# Patient Record
Sex: Male | Born: 1940 | Race: White | Hispanic: No | Marital: Married | Smoking: Never smoker
Health system: Southern US, Community
[De-identification: ages and names within clinical notes are randomized; demographics above are authoritative.]

## PROBLEM LIST (undated history)

## (undated) DIAGNOSIS — F419 Anxiety disorder, unspecified: Secondary | ICD-10-CM

## (undated) DIAGNOSIS — K219 Gastro-esophageal reflux disease without esophagitis: Secondary | ICD-10-CM

## (undated) DIAGNOSIS — F329 Major depressive disorder, single episode, unspecified: Secondary | ICD-10-CM

## (undated) DIAGNOSIS — G4730 Sleep apnea, unspecified: Secondary | ICD-10-CM

## (undated) DIAGNOSIS — N429 Disorder of prostate, unspecified: Secondary | ICD-10-CM

## (undated) DIAGNOSIS — F32A Depression, unspecified: Secondary | ICD-10-CM

## (undated) DIAGNOSIS — R42 Dizziness and giddiness: Secondary | ICD-10-CM

## (undated) DIAGNOSIS — F410 Panic disorder [episodic paroxysmal anxiety] without agoraphobia: Secondary | ICD-10-CM

## (undated) DIAGNOSIS — E785 Hyperlipidemia, unspecified: Secondary | ICD-10-CM

## (undated) DIAGNOSIS — I10 Essential (primary) hypertension: Secondary | ICD-10-CM

## (undated) HISTORY — PX: COLONOSCOPY W/ BIOPSIES: SHX1374

## (undated) HISTORY — DX: Gastro-esophageal reflux disease without esophagitis: K21.9

## (undated) HISTORY — DX: Anxiety disorder, unspecified: F41.9

## (undated) HISTORY — DX: Disorder of prostate, unspecified: N42.9

## (undated) HISTORY — DX: Sleep apnea, unspecified: G47.30

## (undated) HISTORY — DX: Panic disorder (episodic paroxysmal anxiety): F41.0

## (undated) HISTORY — DX: Hyperlipidemia, unspecified: E78.5

## (undated) HISTORY — DX: Major depressive disorder, single episode, unspecified: F32.9

## (undated) HISTORY — DX: Essential (primary) hypertension: I10

## (undated) HISTORY — DX: Dizziness and giddiness: R42

## (undated) HISTORY — DX: Depression, unspecified: F32.A

## (undated) HISTORY — PX: UPPER GASTROINTESTINAL ENDOSCOPY: SHX188

---

## 1990-10-19 HISTORY — PX: TRANSURETHRAL RESECTION OF PROSTATE: SHX73

## 1996-10-19 HISTORY — PX: INGUINAL HERNIA REPAIR: SUR1180

## 2014-08-21 ENCOUNTER — Telehealth: Payer: Self-pay | Admitting: Internal Medicine

## 2014-08-27 NOTE — Telephone Encounter (Signed)
Dr Leone PayorGessner spoke with patient , transient constipation .  Dr Leone PayorGessner said to place colon recall for 06/2016.  Recall put into epic.

## 2015-06-13 ENCOUNTER — Encounter: Payer: Self-pay | Admitting: Internal Medicine

## 2015-08-22 ENCOUNTER — Encounter: Payer: Self-pay | Admitting: Internal Medicine

## 2015-08-22 ENCOUNTER — Ambulatory Visit (INDEPENDENT_AMBULATORY_CARE_PROVIDER_SITE_OTHER): Payer: Medicare Other | Admitting: Internal Medicine

## 2015-08-22 VITALS — BP 96/70 | HR 68 | Ht 66.25 in | Wt 169.5 lb

## 2015-08-22 DIAGNOSIS — K219 Gastro-esophageal reflux disease without esophagitis: Secondary | ICD-10-CM | POA: Diagnosis not present

## 2015-08-22 DIAGNOSIS — R1314 Dysphagia, pharyngoesophageal phase: Secondary | ICD-10-CM | POA: Diagnosis not present

## 2015-08-22 DIAGNOSIS — R1310 Dysphagia, unspecified: Secondary | ICD-10-CM

## 2015-08-22 DIAGNOSIS — R1319 Other dysphagia: Secondary | ICD-10-CM

## 2015-08-22 MED ORDER — PANTOPRAZOLE SODIUM 40 MG PO TBEC: 40.0000 mg | DELAYED_RELEASE_TABLET | Freq: Every day | ORAL | Status: DC

## 2015-08-22 NOTE — Progress Notes (Signed)
   Subjective:    Patient ID: Tristan RaiderJulio Sherman, male    DOB: [birth year], 74 y.o.   MRN: [id number] Cc: swallowing problems and indigestion HPI The patient has a several month hx of intermittent solid dysphagia and heartburn w/ regurgitation. Some increasing frequency of sxs, now several x a week though better since cutting food smaller and chewing more. No unintentional weight loss. Has not tried any OTC Tx or Rx. About to become a grandfather for 9th time. GI ROS o/w negative. Medications, allergies, past medical history, past surgical history, family history and social history are reviewed and updated in the EMR.   Review of Systems Some urinary frequency and leakage all other ROS neg or as per HPI    Objective:   Physical Exam @BP  96/70 mmHg  Pulse 68  Ht 5' 6.25" (1.683 m)  Wt 169 lb 8 oz (76.885 kg)  BMI 27.14 kg/m2@  General:  Well-developed, well-nourished and in no acute distress Eyes:  anicteric. ENT:   Mouth and posterior pharynx free of lesions. + dentures - fit well Neck:   supple w/o thyromegaly or mass.  Lungs: Clear to auscultation bilaterally. Heart:  S1S2, no rubs, murmurs, gallops. Abdomen:  soft, non-tender, no hepatosplenomegaly, hernia, or mass and BS+.  Lymph:  no cervical or supraclavicular adenopathy. Extremities:   no edema, cyanosis or clubbing Skin   no rash. Neuro:  A&O x 3.  Psych:  appropriate mood and  Affect.     Assessment & Plan:  Esophageal dysphagia  Gastroesophageal reflux disease, esophagitis presence not specified  Start PPI - pantoprazole 40 mg qd EGD, possible esophageal dilation The risks and benefits as well as alternatives of endoscopic procedure(s) have been discussed and reviewed. All questions answered. The patient agrees to proceed.  I appreciate the opportunity to care for this patient.  OV:FIEPPIRJJ,OACZYSCc:POMPOSINI,DANIEL L, MD

## 2015-08-22 NOTE — Patient Instructions (Addendum)
   You have been scheduled for an endoscopy. Please follow written instructions given to you at your visit today. If you use inhalers (even only as needed), please bring them with you on the day of your procedure.  We have sent the following medications to your pharmacy for you to pick up at your convenience: Protonix  Call us if you worsen between now and then.  I appreciate the opportunity to care for you. Stan Headarl Gessner, MD, [facility]

## 2015-08-25 ENCOUNTER — Encounter: Payer: Self-pay | Admitting: Internal Medicine

## 2015-10-25 ENCOUNTER — Ambulatory Visit (AMBULATORY_SURGERY_CENTER): Payer: Medicare Other | Admitting: Internal Medicine

## 2015-10-25 ENCOUNTER — Encounter: Payer: Self-pay | Admitting: Internal Medicine

## 2015-10-25 VITALS — BP 129/76 | HR 69 | Temp 99.1°F | Resp 28 | Ht 66.25 in | Wt 169.0 lb

## 2015-10-25 DIAGNOSIS — R1310 Dysphagia, unspecified: Secondary | ICD-10-CM

## 2015-10-25 DIAGNOSIS — R1319 Other dysphagia: Secondary | ICD-10-CM

## 2015-10-25 DIAGNOSIS — R1314 Dysphagia, pharyngoesophageal phase: Secondary | ICD-10-CM

## 2015-10-25 MED ORDER — SODIUM CHLORIDE 0.9 % IV SOLN: 500.0000 mL | INTRAVENOUS | Status: DC

## 2015-10-25 NOTE — Patient Instructions (Addendum)
I did not see any problems but decided to stretch the esophagus to help the swallowing problems.  Please stay on your medications and let me know if not doing better.  I appreciate the opportunity to care for you. Iva Booparl E. Gessner, MD, [facility] [place]  Clear liquids until 1:30 then soft diet remainder of the day.  Resume regular diet tomorrow.     YOU HAD AN ENDOSCOPIC PROCEDURE TODAY AT THE [facility]:   Refer to the procedure report that was given to you for any specific questions about what was found during the examination.  If the procedure report does not answer your questions, please call your gastroenterologist to clarify.  If you requested that your care partner not be given the details of your procedure findings, then the procedure report has been included in a sealed envelope for you to review at your convenience later.  YOU SHOULD EXPECT: Some feelings of bloating in the abdomen. Passage of more gas than usual.  Walking can help get rid of the air that was put into your GI tract during the procedure and reduce the bloating. If you had a lower endoscopy (such as a colonoscopy or flexible sigmoidoscopy) you may notice spotting of blood in your stool or on the toilet paper. If you underwent a bowel prep for your procedure, you may not have a normal bowel movement for a few days.  Please Note:  You might notice some irritation and congestion in your nose or some drainage.  This is from the oxygen used during your procedure.  There is no need for concern and it should clear up in a day or so.  SYMPTOMS TO REPORT IMMEDIATELY:    Following upper endoscopy (EGD)  Vomiting of blood or coffee ground material  New chest pain or pain under the shoulder blades  Painful or persistently difficult swallowing  New shortness of breath  Fever of 100F or higher  Black, tarry-looking stools  For urgent or emergent issues, a gastroenterologist can be reached at any hour by calling [phone]  [phone].   DIET: Your first meal following the procedure should be a small meal and then it is ok to progress to your normal diet. Heavy or fried foods are harder to digest and may make you feel nauseous or bloated.  Likewise, meals heavy in dairy and vegetables can increase bloating.  Drink plenty of fluids but you should avoid alcoholic beverages for 24 hours.  ACTIVITY:  You should plan to take it easy for the rest of today and you should NOT DRIVE or use heavy machinery until tomorrow (because of the sedation medicines used during the test).    FOLLOW UP: Our staff will call the number listed on your records the next business day following your procedure to check on you and address any questions or concerns that you may have regarding the information given to you following your procedure. If we do not reach you, we will leave a message.  However, if you are feeling well and you are not experiencing any problems, there is no need to return our call.  We will assume that you have returned to your regular daily activities without incident.  If any biopsies were taken you will be contacted by phone or by letter within the next 1-3 weeks.  Please call us at [phone] if you have not heard about the biopsies in 3 weeks.    SIGNATURES/CONFIDENTIALITY: You and/or your care partner have signed paperwork which  will be entered into your electronic medical record.  These signatures attest to the fact that that the information above on your After Visit Summary has been reviewed and is understood.  Full responsibility of the confidentiality of this discharge information lies with you and/or your care-partner.

## 2015-10-25 NOTE — Progress Notes (Signed)
Stable to RR 

## 2015-10-25 NOTE — Op Note (Signed)
[facility] [address] [place], [id number]   ENDOSCOPY PROCEDURE REPORT  PATIENT: [name], Tristan  MR#: [id number] BIRTHDATE: 1940/11/12 , 74  yrs. old GENDER: male ENDOSCOPIST: Iva Booparl E Berneita Sanagustin, MD, [facility] PROCEDURE DATE:  10/25/2015 PROCEDURE:  EGD, diagnostic and Maloney dilation of esophagus ASA CLASS:     Class II INDICATIONS:  dysphagia. MEDICATIONS: Propofol 100 mg IV, Monitored anesthesia care, and Lidocaine 40 mg IV TOPICAL ANESTHETIC: none  DESCRIPTION OF PROCEDURE: After the risks benefits and alternatives of the procedure were thoroughly explained, informed consent was obtained.  The LB [id number] endoscope was introduced through the mouth and advanced to the second portion of the duodenum , Without limitations.  The instrument was slowly withdrawn as the mucosa was fully examined.      EXAM: The esophagus and gastroesophageal junction were completely normal in appearance.  The stomach was entered and closely examined.The antrum, angularis, and lesser curvature were well visualized, including a retroflexed view of the cardia and fundus. The stomach wall was normally distensable.  The scope passed easily through the pylorus into the duodenum.  Retroflexed views revealed no abnormalities.     The scope was then withdrawn , a 54 Fr Maloney dilator passed with ease and no heme, and the procedure completed.  COMPLICATIONS: There were no immediate complications.  ENDOSCOPIC IMPRESSION: Normal appearing esophagus and GE junction, the stomach was well visualized and normal in appearance, normal appearing duodenum - 54 Fr Maloney passed to treat dysphagia  RECOMMENDATIONS: 1.  Clear liquids until 130 PM , then soft foods rest of day. Resume prior diet tomorrow. 2.  Continue PPI     - pantoprazole daily   eSigned:  Iva Booparl E Keyera Hattabaugh, MD, [facility] 10/25/2015 12:22 PM    CC:The Patient and Dr. Reuel Boomaniel Pomposini

## 2015-10-25 NOTE — Progress Notes (Signed)
Called to room to assist during endoscopic procedure.  Patient ID and intended procedure confirmed with present staff. Received instructions for my participation in the procedure from the performing physician.  

## 2015-10-29 ENCOUNTER — Telehealth: Payer: Self-pay | Admitting: *Deleted

## 2015-10-29 NOTE — Telephone Encounter (Signed)
  Follow up Call-  Call back number 10/25/2015  Post procedure Call Back phone  # [phone]     Patient questions:  Do you have a fever, pain , or abdominal swelling? No. Pain Score  0 *  Have you tolerated food without any problems? Yes.    Have you been able to return to your normal activities? Yes.    Do you have any questions about your discharge instructions: Diet   No. Medications  No. Follow up visit  No.  Do you have questions or concerns about your Care? No.  Actions: * If pain score is 4 or above: No action needed, pain <4.

## 2016-04-30 ENCOUNTER — Other Ambulatory Visit: Payer: Self-pay | Admitting: Internal Medicine

## 2016-06-14 ENCOUNTER — Emergency Department (HOSPITAL_COMMUNITY)
Admission: EM | Admit: 2016-06-14 | Discharge: 2016-06-14 | Disposition: A | Discharge status: Home / Self Care | Payer: Medicare Other | Attending: Emergency Medicine | Admitting: Emergency Medicine

## 2016-06-14 ENCOUNTER — Encounter (HOSPITAL_COMMUNITY): Payer: Self-pay | Admitting: Emergency Medicine

## 2016-06-14 DIAGNOSIS — Z7982 Long term (current) use of aspirin: Secondary | ICD-10-CM | POA: Insufficient documentation

## 2016-06-14 DIAGNOSIS — F419 Anxiety disorder, unspecified: Secondary | ICD-10-CM | POA: Insufficient documentation

## 2016-06-14 DIAGNOSIS — R42 Dizziness and giddiness: Secondary | ICD-10-CM | POA: Insufficient documentation

## 2016-06-14 DIAGNOSIS — Z79899 Other long term (current) drug therapy: Secondary | ICD-10-CM | POA: Insufficient documentation

## 2016-06-14 DIAGNOSIS — I10 Essential (primary) hypertension: Secondary | ICD-10-CM | POA: Diagnosis not present

## 2016-06-14 LAB — CBC
HCT: 41.4 % (ref 39.0–52.0)
Hemoglobin: 14 g/dL (ref 13.0–17.0)
MCH: 30.2 pg (ref 26.0–34.0)
MCHC: 33.8 g/dL (ref 30.0–36.0)
MCV: 89.4 fL (ref 78.0–100.0)
Platelets: 206 K/uL (ref 150–400)
RBC: 4.63 MIL/uL (ref 4.22–5.81)
RDW: 12.8 % (ref 11.5–15.5)
WBC: 7.5 K/uL (ref 4.0–10.5)

## 2016-06-14 LAB — BASIC METABOLIC PANEL
Anion gap: 5 (ref 5–15)
BUN: 15 mg/dL (ref 6–20)
CHLORIDE: 108 mmol/L (ref 101–111)
CO2: 26 mmol/L (ref 22–32)
CREATININE: 0.95 mg/dL (ref 0.61–1.24)
Calcium: 8.5 mg/dL — ABNORMAL LOW (ref 8.9–10.3)
GFR calc Af Amer: >60 mL/min (ref >60)
GFR calc non Af Amer: >60 mL/min (ref >60)
Glucose, Bld: 131 mg/dL — ABNORMAL HIGH (ref 65–99)
Potassium: 4 mmol/L (ref 3.5–5.1)
Sodium: 139 mmol/L (ref 135–145)

## 2016-06-14 LAB — URINALYSIS, ROUTINE W REFLEX MICROSCOPIC
Bilirubin Urine: NEGATIVE
GLUCOSE, UA: NEGATIVE mg/dL
HGB URINE DIPSTICK: NEGATIVE
Ketones, ur: NEGATIVE mg/dL
Leukocytes, UA: NEGATIVE
Nitrite: NEGATIVE
PH: 5.5 (ref 5.0–8.0)
PROTEIN: NEGATIVE mg/dL
SPECIFIC GRAVITY, URINE: 1.011 (ref 1.005–1.030)

## 2016-06-14 LAB — TROPONIN I: Troponin I: <0.03 ng/mL (ref <0.03)

## 2016-06-14 MED ORDER — ALPRAZOLAM 0.25 MG PO TABS
0.2500 mg | ORAL_TABLET | Freq: Once | ORAL | Status: AC
  Administered 2016-06-14: 0.25 mg via ORAL
  Filled 2016-06-14: qty 1

## 2016-06-14 MED ORDER — ALPRAZOLAM 0.25 MG PO TABS: 0.2500 mg | ORAL_TABLET | Freq: Two times a day (BID) | ORAL | 0 refills | Status: DC | PRN

## 2016-06-14 NOTE — ED Triage Notes (Signed)
Pt c/o headache/pressure, twitching muscles, anxiety, death anxiety onset this morning, and difficulty focusing vision onset 0930. Pt has ongoing dizziness when he walks upstairs, had syncopal episode yesterday. BP 151/110. No CP.

## 2016-06-14 NOTE — ED Provider Notes (Signed)
75 year old male presents with increasing anxiety as well as dizziness and impending doom. Long-standing history of anxiety. Seen today earlier in urgent care center workup was without acute findings. Workup here today is also reassuring. Patient will be prescribed Xanax and follow-up with Dr.   ED ECG REPORT   Date: 06/14/2016  Rate: 75  Rhythm: normal sinus rhythm  QRS Axis: normal  Intervals: normal  ST/T Wave abnormalities: normal  Conduction Disutrbances:none  Narrative Interpretation:   Old EKG Reviewed: none available  I have personally reviewed the EKG tracing and agree with the computerized printout as noted.   Lorre NickAnthony Bernetha Anschutz, MD 06/14/16 2121

## 2016-06-14 NOTE — ED Provider Notes (Signed)
[facility]-EMERGENCY DEPT Provider Note   CSN: [id number] Arrival date & time: 06/14/16  1847  By signing my name below, I, Jasmyn B. Alexander, attest that this documentation has been prepared under the direction and in the presence of Luree Palla, PA-C. Electronically Signed: Gillis Ends. Lyn Hollingshead, ED Scribe. 06/14/16. 8:32 PM.   History   Chief Complaint Chief Complaint  Patient presents with  . Dizziness  . Hypertension    HPI HPI Comments: Tristan Sherman is a 75 y.o. male with PMHx of GERD, HLD, and Orthostatic HTN who presents to the Emergency Department complaining of gradual onset, intermittent, dizziness x 0930. He reports that symptoms began while he was at church earlier this morning. Per pt, he notes that his wife is having a knee replacement on 06/15/16 which could possibly be causing him anxiety. Pt has associated headache, anxiety, and muscle spasms in his lower back. He reports that he feels like "dying". He states everything is "moving in slow motion" when symptoms are present. He had chills which have now resolved. He states that he went to [facility] in [place], [place] early afternoon where he received IV fluids due to possible dehydration. His headache is exacerbated when standing and when pt is hypertensive. Pt's wife notes that his pressure was in the 170's after receiving IV fluids. Symptoms were partially resolved after visit and later returned which caused him to come to [facility]. Denies any abdominal pain, nausea, vomiting, diarrhea, and chest pain.  The history is provided by the patient and the spouse. No language interpreter was used.   Associated symptoms: headaches   Associated symptoms: no chest pain, no diarrhea, no nausea and no vomiting    Associated symptoms include headaches. Pertinent negatives include no chest pain and no abdominal pain.    Past Medical History:  Diagnosis Date  . GERD (gastroesophageal reflux disease)   . HLD (hyperlipidemia)     . Prostatic disorder   . Sleep apnea    uses CPAP    Patient Active Problem List   Diagnosis Date Noted  . Esophageal dysphagia 08/22/2015  . GERD (gastroesophageal reflux disease) 08/22/2015    Past Surgical History:  Procedure Laterality Date  . COLONOSCOPY W/ BIOPSIES    . INGUINAL HERNIA REPAIR Bilateral   . TRANSURETHRAL RESECTION OF PROSTATE      Home Medications    Prior to Admission medications   Medication Sig Start Date End Date Taking? Authorizing Provider  aspirin 81 MG tablet Take 81 mg by mouth daily.    Historical Provider, MD  Cholecalciferol (VITAMIN D-3) 1000 UNITS CAPS Take 1 capsule by mouth daily.    Historical Provider, MD  finasteride (PROSCAR) 5 MG tablet Take 5 mg by mouth daily.    Historical Provider, MD  pantoprazole (PROTONIX) 40 MG tablet TAKE ONE TABLET BY MOUTH ONCE DAILY BEFORE BREAKFAST 05/01/16   Iva Boop, MD  rosuvastatin (CRESTOR) 10 MG tablet Take 10 mg by mouth daily.    Historical Provider, MD    Family History Family History  Problem Relation Age of Onset  . Heart disease Mother   . Lung disease Mother   . Kidney cancer Brother     1/2 brother    Social History Social History  Substance Use Topics  . Smoking status: Never Smoker  . Smokeless tobacco: Never Used  . Alcohol use No    Allergies   Review of patient's allergies indicates no known allergies.  Review of Systems Review of Systems  Constitutional: Positive for chills.  Cardiovascular: Negative for chest pain.  Gastrointestinal: Negative for abdominal pain, diarrhea, nausea and vomiting.  Neurological: Positive for dizziness and headaches.  Psychiatric/Behavioral: The patient is nervous/anxious.   All other systems reviewed and are negative.  Physical Exam Updated Vital Signs BP (!) 151/110 (BP Location: Left Arm)   Pulse 83   Temp 98.5 F (36.9 C) (Oral)   Resp 16   Ht 5\' 7"  (1.702 m)   Wt 171 lb 6.4 oz (77.7 kg)   SpO2 100%   BMI 26.85 kg/m    Physical Exam  Constitutional: He is oriented to person, place, and time. He appears well-developed and well-nourished.  HENT:  Head: Normocephalic and atraumatic.  Eyes: EOM are normal. Pupils are equal, round, and reactive to light.  No nystagmus  Neck: Normal range of motion. Neck supple.  Cardiovascular: Normal rate, regular rhythm and normal heart sounds.   Pulmonary/Chest: Effort normal and breath sounds normal. No respiratory distress. He has no wheezes. He has no rales.  Abdominal: Soft. Bowel sounds are normal. He exhibits no distension. There is no tenderness. There is no rebound and no guarding.  Musculoskeletal: Normal range of motion.  Neurological: He is alert and oriented to person, place, and time.  5/5 and equal upper and lower extremity strength bilaterally. Equal grip strength bilaterally. Normal finger to nose and heel to shin. No pronator drift.   Skin: Skin is warm and dry. No rash noted.  Psychiatric: He has a normal mood and affect. Judgment normal.  Nursing note and vitals reviewed.  ED Treatments / Results  DIAGNOSTIC STUDIES: Oxygen Saturation is 100% on RA, normal by my interpretation.    COORDINATION OF CARE: 8:21 PM-Discussed treatment plan which includes orthostatic vital signs with pt at bedside and pt agreed to plan.   Labs (all labs ordered are listed, but only abnormal results are displayed) Labs Reviewed  BASIC METABOLIC PANEL - Abnormal; Notable for the following:       Result Value   Glucose, Bld 131 (*)    Calcium 8.5 (*)    All other components within normal limits  CBC  URINALYSIS, ROUTINE W REFLEX MICROSCOPIC (NOT AT [facility])  TROPONIN I    EKG  EKG Interpretation None      Procedures Procedures (including critical care time)  Medications Ordered in ED Medications - No data to display  Initial Impression / Assessment and Plan / ED Course  I have reviewed the triage vital signs and the nursing notes.  Pertinent labs &  imaging results that were available during my care of the patient were reviewed by me and considered in my medical decision making (see chart for details).  Clinical Course  Comment By Time  Patient seen and examined, patient mild hypertension, appears anxious. Complaining of several episodes of dizziness throughout the day today. Has been seen at urgent care, received fluids, labs unremarkable that time. Symptoms did not improve so he returned to the ED. Will get labs, EKG, troponin, orthostatics. Jaynie Crumbleatyana Rosalynd Mcwright, PA-C 08/27 2050  Labs unremarkable. Patient is not orthostatic. He is hypertensive. Discussed with Dr. Freida BusmanAllen who has seen patient as well. Symptoms seem to be mostly due to anxiety and panic attack, exacerbated by patient worrying about his wife who is post to have a knee replacement tomorrow morning. Will treat with Xanax. Follow-up with family doctor. Jaynie Crumbleatyana Chianne Byrns, PA-C 08/27 2137    Vitals:   06/14/16 1858 06/14/16 1859  BP: (!) 151/110  Pulse: 83   Resp: 16   Temp: 98.5 F (36.9 C)   TempSrc: Oral   SpO2: 100%   Weight:  77.7 kg  Height:  5\' 7"  (1.702 m)     Final Clinical Impressions(s) / ED Diagnoses   Final diagnoses:  Dizziness  Anxiety    New Prescriptions New Prescriptions   ALPRAZOLAM (XANAX) 0.25 MG TABLET    Take 1 tablet (0.25 mg total) by mouth 2 (two) times daily as needed for anxiety.   I personally performed the services described in this documentation, which was scribed in my presence. The recorded information has been reviewed and is accurate.    Jaynie Crumble, PA-C 06/14/16 2139

## 2016-06-14 NOTE — ED Notes (Signed)
Patient d/c'd in the care of family.  F/U and medications reviewed.  Patient verbalized understanding. 

## 2016-06-14 NOTE — Discharge Instructions (Signed)
Blood work today is normal. Make sure to follow up with primary care doctor. Take xanax as prescribed as needed for anxiety. Return if worsening symptoms.

## 2016-07-29 ENCOUNTER — Encounter: Payer: Self-pay | Admitting: Internal Medicine

## 2016-08-05 ENCOUNTER — Encounter: Payer: Self-pay | Admitting: Internal Medicine

## 2016-09-30 ENCOUNTER — Ambulatory Visit (AMBULATORY_SURGERY_CENTER): Payer: Self-pay | Admitting: *Deleted

## 2016-09-30 VITALS — Ht 67.0 in | Wt 176.2 lb

## 2016-09-30 DIAGNOSIS — Z1211 Encounter for screening for malignant neoplasm of colon: Secondary | ICD-10-CM

## 2016-09-30 NOTE — Progress Notes (Signed)
No allergies to eggs or soy. No problems with anesthesia.  Pt given Emmi instructions for colonoscopy  No oxygen use  No diet drug use  

## 2016-10-14 ENCOUNTER — Ambulatory Visit (AMBULATORY_SURGERY_CENTER): Payer: Medicare Other | Admitting: Internal Medicine

## 2016-10-14 ENCOUNTER — Encounter: Payer: Self-pay | Admitting: Internal Medicine

## 2016-10-14 VITALS — BP 139/99 | HR 65 | Temp 97.3°F | Resp 16 | Ht 67.0 in | Wt 176.0 lb

## 2016-10-14 DIAGNOSIS — Z1211 Encounter for screening for malignant neoplasm of colon: Secondary | ICD-10-CM

## 2016-10-14 DIAGNOSIS — Z1212 Encounter for screening for malignant neoplasm of rectum: Secondary | ICD-10-CM | POA: Diagnosis not present

## 2016-10-14 MED ORDER — SODIUM CHLORIDE 0.9 % IV SOLN: 500.0000 mL | INTRAVENOUS | Status: DC

## 2016-10-14 NOTE — Progress Notes (Signed)
To PACU Pt awake and alert. Report to RN 

## 2016-10-14 NOTE — Progress Notes (Signed)
No problems noted in the recovery room. maw 

## 2016-10-14 NOTE — Op Note (Signed)
[facility] Patient Name: Donovyn Guidice Procedure Date: 10/14/2016 9:07 AM MRN: [id number] Endoscopist: Iva Boop , MD Age: 75 Referring MD:  Date of Birth: 01-03-1941 Gender: Male Account #: [id number] Procedure:                Colonoscopy Indications:              Screening for colorectal malignant neoplasm, Last                            colonoscopy: September 2007 Medicines:                Propofol per Anesthesia, Monitored Anesthesia Care Procedure:                Pre-Anesthesia Assessment:                           - Prior to the procedure, a History and Physical                            was performed, and patient medications and                            allergies were reviewed. The patient's tolerance of                            previous anesthesia was also reviewed. The risks                            and benefits of the procedure and the sedation                            options and risks were discussed with the patient.                            All questions were answered, and informed consent                            was obtained. Prior Anticoagulants: The patient has                            taken no previous anticoagulant or antiplatelet                            agents. ASA Grade Assessment: II - A patient with                            mild systemic disease. After reviewing the risks                            and benefits, the patient was deemed in                            satisfactory condition to undergo the procedure.  After obtaining informed consent, the colonoscope                            was passed under direct vision. Throughout the                            procedure, the patient's blood pressure, pulse, and                            oxygen saturations were monitored continuously. The                            Model CF-[id number](SN#[id number]) scope was introduced                            through  the anus and advanced to the the cecum,                            identified by appendiceal orifice and ileocecal                            valve. The colonoscopy was performed without                            difficulty. The patient tolerated the procedure                            well. The quality of the bowel preparation was                            good. The bowel preparation used was Miralax. The                            ileocecal valve, appendiceal orifice, and rectum                            were photographed. Scope In: 9:23:11 AM Scope Out: 9:38:35 AM Scope Withdrawal Time: 0 hours 11 minutes 1 second  Total Procedure Duration: 0 hours 15 minutes 24 seconds  Findings:                 The perianal and digital rectal examinations were                            normal. Pertinent negatives include normal prostate                            (size, shape, and consistency).                           The entire examined colon appeared normal on direct                            and retroflexion views. Complications:  No immediate complications. Estimated Blood Loss:     Estimated blood loss: none. Impression:               - The entire examined colon is normal on direct and                            retroflexion views.                           - No specimens collected. Recommendation:           - Patient has a contact number available for                            emergencies. The signs and symptoms of potential                            delayed complications were discussed with the                            patient. Return to normal activities tomorrow.                            Written discharge instructions were provided to the                            patient.                           - Resume previous diet.                           - Continue present medications.                           - No repeat colonoscopy due to age and the absence                             of colonic polyps. Iva Booparl E Tiarrah Saville, MD 10/14/2016 9:45:24 AM This report has been signed electronically.

## 2016-10-14 NOTE — Patient Instructions (Addendum)
The colonoscopy was normal.  I do not think you will need another routine colonoscopy - as we usually stop these after late 70's/80's and you will be 84 in 10 years.  I appreciate the opportunity to care for you. Iva Booparl E. Gessner, MD, FACG    YOU HAD AN ENDOSCOPIC PROCEDURE TODAY AT THE [facility]:   Refer to the procedure report that was given to you for any specific questions about what was found during the examination.  If the procedure report does not answer your questions, please call your gastroenterologist to clarify.  If you requested that your care partner not be given the details of your procedure findings, then the procedure report has been included in a sealed envelope for you to review at your convenience later.  YOU SHOULD EXPECT: Some feelings of bloating in the abdomen. Passage of more gas than usual.  Walking can help get rid of the air that was put into your GI tract during the procedure and reduce the bloating. If you had a lower endoscopy (such as a colonoscopy or flexible sigmoidoscopy) you may notice spotting of blood in your stool or on the toilet paper. If you underwent a bowel prep for your procedure, you may not have a normal bowel movement for a few days.  Please Note:  You might notice some irritation and congestion in your nose or some drainage.  This is from the oxygen used during your procedure.  There is no need for concern and it should clear up in a day or so.  SYMPTOMS TO REPORT IMMEDIATELY:   Following lower endoscopy (colonoscopy or flexible sigmoidoscopy):  Excessive amounts of blood in the stool  Significant tenderness or worsening of abdominal pains  Swelling of the abdomen that is new, acute  Fever of 100F or higher   Following upper endoscopy (EGD)  Vomiting of blood or coffee ground material  New chest pain or pain under the shoulder blades  Painful or persistently difficult swallowing  New shortness of breath  Fever of  100F or higher  Black, tarry-looking stools  For urgent or emergent issues, a gastroenterologist can be reached at any hour by calling [phone].   DIET:  We do recommend a small meal at first, but then you may proceed to your regular diet.  Drink plenty of fluids but you should avoid alcoholic beverages for 24 hours.  ACTIVITY:  You should plan to take it easy for the rest of today and you should NOT DRIVE or use heavy machinery until tomorrow (because of the sedation medicines used during the test).    FOLLOW UP: Our staff will call the number listed on your records the next business day following your procedure to check on you and address any questions or concerns that you may have regarding the information given to you following your procedure. If we do not reach you, we will leave a message.  However, if you are feeling well and you are not experiencing any problems, there is no need to return our call.  We will assume that you have returned to your regular daily activities without incident.  If any biopsies were taken you will be contacted by phone or by letter within the next 1-3 weeks.  Please call us at [phone] if you have not heard about the biopsies in 3 weeks.    SIGNATURES/CONFIDENTIALITY: You and/or your care partner have signed paperwork which will be entered into your electronic medical record.  These signatures attest to the fact that that the information above on your After Visit Summary has been reviewed and is understood.  Full responsibility of the confidentiality of this discharge information lies with you and/or your care-partner.    You may resume your current medications today. The entire colon was normal.  No specimens were taken. No repeat colonoscopy due to age and the absence of colonic polyps. Please call if any questions or concerns.

## 2016-10-15 ENCOUNTER — Telehealth: Payer: Self-pay | Admitting: *Deleted

## 2016-10-15 NOTE — Telephone Encounter (Signed)
  Follow up Call-  Call back number 10/14/2016 10/25/2015  Post procedure Call Back phone  # [phone]  Permission to leave phone message Yes -     Patient questions:  Do you have a fever, pain , or abdominal swelling? No. Pain Score  0 *  Have you tolerated food without any problems? Yes.    Have you been able to return to your normal activities? Yes.    Do you have any questions about your discharge instructions: Diet   No. Medications  No. Follow up visit  No.  Do you have questions or concerns about your Care? No.  Actions: * If pain score is 4 or above: No action needed, pain <4.

## 2018-05-24 ENCOUNTER — Other Ambulatory Visit: Payer: Self-pay | Admitting: Internal Medicine

## 2019-02-21 ENCOUNTER — Other Ambulatory Visit: Payer: Self-pay | Admitting: Internal Medicine

## 2019-06-05 ENCOUNTER — Other Ambulatory Visit: Payer: Self-pay | Admitting: Internal Medicine

## 2019-06-05 ENCOUNTER — Telehealth: Payer: Self-pay | Admitting: Internal Medicine

## 2019-06-05 MED ORDER — PANTOPRAZOLE SODIUM 40 MG PO TBEC: DELAYED_RELEASE_TABLET | ORAL | 0 refills | Status: DC

## 2019-06-05 NOTE — Telephone Encounter (Signed)
Pt requested a refill for pantoprazole sent to [facility] in [place].

## 2019-06-05 NOTE — Telephone Encounter (Signed)
Spoke with his wife [place] and made him a September appointment, last seen in December 2017. Sent in his pantoprazole to [employer] as requested to cover him.

## 2019-07-06 ENCOUNTER — Encounter: Payer: Self-pay | Admitting: Internal Medicine

## 2019-07-06 ENCOUNTER — Ambulatory Visit (INDEPENDENT_AMBULATORY_CARE_PROVIDER_SITE_OTHER): Payer: Medicare Other | Admitting: Internal Medicine

## 2019-07-06 ENCOUNTER — Other Ambulatory Visit: Payer: Self-pay

## 2019-07-06 DIAGNOSIS — K2100 Gastro-esophageal reflux disease with esophagitis, without bleeding: Secondary | ICD-10-CM

## 2019-07-06 MED ORDER — PANTOPRAZOLE SODIUM 40 MG PO TBEC: DELAYED_RELEASE_TABLET | ORAL | 3 refills | Status: DC

## 2019-07-06 NOTE — Assessment & Plan Note (Signed)
Refill PPI See me 2 yrs

## 2019-07-06 NOTE — Progress Notes (Signed)
   Tristan Joslyn Sr. 78 y.o. 12-Oct-1941 [id number]  Assessment & Plan:  GERD (gastroesophageal reflux disease) Refill PPI See me 2 yrs  WU:[id number], Cherly Anderson, MD  Subjective:   Chief Complaint: GERD f/u  HPI 78 yo man w/ hx GERD here w/ wife for f/u Doing well onPPI 'Hx dysphagia and Tx w/ maloney dilation 2017 but no sxs now  Having some fluctuating BP - dizziness, light-headed negative cardiology w/u    No Known Allergies Current Meds  Medication Sig  . aspirin 81 MG tablet Take 81 mg by mouth daily.  . busPIRone (BUSPAR) 10 MG tablet Take 10 mg by mouth 2 (two) times daily.  . Cholecalciferol (VITAMIN D-3) 1000 UNITS CAPS Take 1 capsule by mouth daily.  Marland Kitchen escitalopram (LEXAPRO) 20 MG tablet Take 20 mg by mouth daily.  . pantoprazole (PROTONIX) 40 MG tablet TAKE 1 TABLET BY MOUTH ONCE DAILY BEFORE BREAKFAST  . rosuvastatin (CRESTOR) 10 MG tablet Take 10 mg by mouth daily.   Past Medical History:  Diagnosis Date  . Anxiety   . Depression   . GERD (gastroesophageal reflux disease)   . HLD (hyperlipidemia)   . Panic attacks   . Prostatic disorder   . Sleep apnea    uses CPAP   Past Surgical History:  Procedure Laterality Date  . COLONOSCOPY W/ BIOPSIES    . INGUINAL HERNIA REPAIR Bilateral 1998   1965, left inguinal  . TRANSURETHRAL RESECTION OF PROSTATE  1992  . UPPER GASTROINTESTINAL ENDOSCOPY     Social History   Social History Narrative   Married, retired   3 sons and 1 daughter   family history includes Heart disease in his mother; Kidney cancer in his brother; Lung disease in his mother.   Review of Systems As above Objective:   Physical Exam BP 132/72   Temp 98.2 F (36.8 C) (Oral)   Ht 5\' 7"  (1.702 m)   Wt 173 lb (78.5 kg)   BMI 27.10 kg/m   NAD WDWN Hispanic man

## 2019-07-06 NOTE — Patient Instructions (Addendum)
We have sent the following medications to your pharmacy for you to pick up at your convenience: Pantoprazole   Please follow up with Dr Carlean Purl in a year or sooner if needed.    I appreciate the opportunity to care for you. Silvano Rusk, MD, [facility]

## 2019-10-20 HISTORY — PX: CATARACT EXTRACTION, BILATERAL: SHX1313

## 2020-07-22 ENCOUNTER — Other Ambulatory Visit: Payer: Self-pay

## 2020-07-22 ENCOUNTER — Emergency Department (HOSPITAL_COMMUNITY)
Admission: EM | Admit: 2020-07-22 | Discharge: 2020-07-24 | Disposition: A | Discharge status: Home / Self Care | Payer: Medicare Other | Attending: Emergency Medicine | Admitting: Emergency Medicine

## 2020-07-22 DIAGNOSIS — R45851 Suicidal ideations: Secondary | ICD-10-CM

## 2020-07-22 DIAGNOSIS — F329 Major depressive disorder, single episode, unspecified: Secondary | ICD-10-CM | POA: Insufficient documentation

## 2020-07-22 DIAGNOSIS — G2571 Drug induced akathisia: Secondary | ICD-10-CM | POA: Diagnosis not present

## 2020-07-22 DIAGNOSIS — I951 Orthostatic hypotension: Secondary | ICD-10-CM | POA: Insufficient documentation

## 2020-07-22 DIAGNOSIS — F419 Anxiety disorder, unspecified: Secondary | ICD-10-CM | POA: Diagnosis present

## 2020-07-22 DIAGNOSIS — Z20822 Contact with and (suspected) exposure to covid-19: Secondary | ICD-10-CM | POA: Diagnosis not present

## 2020-07-22 DIAGNOSIS — F333 Major depressive disorder, recurrent, severe with psychotic symptoms: Secondary | ICD-10-CM | POA: Insufficient documentation

## 2020-07-22 DIAGNOSIS — Z7982 Long term (current) use of aspirin: Secondary | ICD-10-CM | POA: Insufficient documentation

## 2020-07-22 LAB — CBC WITH DIFFERENTIAL/PLATELET
Abs Immature Granulocytes: 0.03 10*3/uL (ref 0.00–0.07)
Basophils Absolute: 0 10*3/uL (ref 0.0–0.1)
Basophils Relative: 0 %
Eosinophils Absolute: 0 10*3/uL (ref 0.0–0.5)
Eosinophils Relative: 1 %
HCT: 42 % (ref 39.0–52.0)
Hemoglobin: 13.8 g/dL (ref 13.0–17.0)
Immature Granulocytes: 1 %
Lymphocytes Relative: 21 %
Lymphs Abs: 1.3 10*3/uL (ref 0.7–4.0)
MCH: 30.1 pg (ref 26.0–34.0)
MCHC: 32.9 g/dL (ref 30.0–36.0)
MCV: 91.5 fL (ref 80.0–100.0)
Monocytes Absolute: 0.6 10*3/uL (ref 0.1–1.0)
Monocytes Relative: 9 %
Neutro Abs: 4.3 10*3/uL (ref 1.7–7.7)
Neutrophils Relative %: 68 %
Platelets: 236 10*3/uL (ref 150–400)
RBC: 4.59 MIL/uL (ref 4.22–5.81)
RDW: 12.2 % (ref 11.5–15.5)
WBC: 6.2 10*3/uL (ref 4.0–10.5)
nRBC: 0 % (ref 0.0–0.2)

## 2020-07-22 LAB — URINALYSIS, ROUTINE W REFLEX MICROSCOPIC
Bacteria, UA: NONE SEEN
Bilirubin Urine: NEGATIVE
Glucose, UA: NEGATIVE mg/dL
Ketones, ur: NEGATIVE mg/dL
Nitrite: NEGATIVE
Protein, ur: 100 mg/dL — AB
Specific Gravity, Urine: 1.012 (ref 1.005–1.030)
pH: 6 (ref 5.0–8.0)

## 2020-07-22 LAB — COMPREHENSIVE METABOLIC PANEL
ALT: 15 U/L (ref 0–44)
AST: 20 U/L (ref 15–41)
Albumin: 4 g/dL (ref 3.5–5.0)
Alkaline Phosphatase: 78 U/L (ref 38–126)
Anion gap: 11 (ref 5–15)
BUN: 17 mg/dL (ref 8–23)
CO2: 27 mmol/L (ref 22–32)
Calcium: 8.9 mg/dL (ref 8.9–10.3)
Chloride: 102 mmol/L (ref 98–111)
Creatinine, Ser: 0.94 mg/dL (ref 0.61–1.24)
GFR calc Af Amer: >60 mL/min (ref >60)
GFR calc non Af Amer: >60 mL/min (ref >60)
Glucose, Bld: 91 mg/dL (ref 70–99)
Potassium: 3.5 mmol/L (ref 3.5–5.1)
Sodium: 140 mmol/L (ref 135–145)
Total Bilirubin: 0.9 mg/dL (ref 0.3–1.2)
Total Protein: 7 g/dL (ref 6.5–8.1)

## 2020-07-22 LAB — RESPIRATORY PANEL BY RT PCR (FLU A&B, COVID)
Influenza A by PCR: NEGATIVE
Influenza B by PCR: NEGATIVE
SARS Coronavirus 2 by RT PCR: NEGATIVE

## 2020-07-22 LAB — T4, FREE: Free T4: 1.13 ng/dL — ABNORMAL HIGH (ref 0.61–1.12)

## 2020-07-22 LAB — TSH: TSH: 0.51 u[IU]/mL (ref 0.350–4.500)

## 2020-07-22 LAB — ETHANOL: Alcohol, Ethyl (B): <10 mg/dL (ref <10)

## 2020-07-22 MED ORDER — SODIUM CHLORIDE 0.9 % IV BOLUS
1000.0000 mL | Freq: Once | INTRAVENOUS | Status: AC
  Administered 2020-07-22: 1000 mL via INTRAVENOUS

## 2020-07-22 MED ORDER — PANTOPRAZOLE SODIUM 40 MG PO TBEC
40.0000 mg | DELAYED_RELEASE_TABLET | Freq: Every day | ORAL | Status: DC
  Administered 2020-07-23 – 2020-07-24 (×2): 40 mg via ORAL
  Filled 2020-07-22 (×2): qty 1

## 2020-07-22 MED ORDER — VITAMIN D3 25 MCG (1000 UNIT) PO TABS
1000.0000 [IU] | ORAL_TABLET | Freq: Every day | ORAL | Status: DC
  Administered 2020-07-23 – 2020-07-24 (×2): 1000 [IU] via ORAL
  Filled 2020-07-22 (×4): qty 1

## 2020-07-22 MED ORDER — MIDODRINE HCL 2.5 MG PO TABS
2.5000 mg | ORAL_TABLET | Freq: Three times a day (TID) | ORAL | Status: DC | PRN
  Filled 2020-07-22: qty 1

## 2020-07-22 MED ORDER — ESCITALOPRAM OXALATE 10 MG PO TABS
20.0000 mg | ORAL_TABLET | Freq: Every day | ORAL | Status: DC
  Administered 2020-07-24: 20 mg via ORAL
  Filled 2020-07-22 (×2): qty 2

## 2020-07-22 MED ORDER — GATIFLOXACIN 0.5 % OP SOLN
1.0000 [drp] | Freq: Four times a day (QID) | OPHTHALMIC | Status: DC
  Administered 2020-07-23 – 2020-07-24 (×5): 1 [drp] via OPHTHALMIC
  Filled 2020-07-22: qty 2.5

## 2020-07-22 MED ORDER — VORTIOXETINE HBR 5 MG PO TABS
10.0000 mg | ORAL_TABLET | Freq: Every day | ORAL | Status: DC
  Administered 2020-07-22: 10 mg via ORAL
  Filled 2020-07-22 (×2): qty 2

## 2020-07-22 MED ORDER — PREDNISOLONE ACETATE 1 % OP SUSP
1.0000 [drp] | Freq: Every day | OPHTHALMIC | Status: DC
  Administered 2020-07-23 – 2020-07-24 (×2): 1 [drp] via OPHTHALMIC

## 2020-07-22 MED ORDER — BUSPIRONE HCL 10 MG PO TABS
10.0000 mg | ORAL_TABLET | Freq: Two times a day (BID) | ORAL | Status: DC
  Filled 2020-07-22 (×2): qty 1

## 2020-07-22 MED ORDER — ROSUVASTATIN CALCIUM 10 MG PO TABS
10.0000 mg | ORAL_TABLET | Freq: Every day | ORAL | Status: DC
  Administered 2020-07-23 – 2020-07-24 (×2): 10 mg via ORAL
  Filled 2020-07-22 (×2): qty 1

## 2020-07-22 MED ORDER — ASPIRIN EC 81 MG PO TBEC
81.0000 mg | DELAYED_RELEASE_TABLET | Freq: Every day | ORAL | Status: DC
  Administered 2020-07-23 – 2020-07-24 (×2): 81 mg via ORAL
  Filled 2020-07-22 (×2): qty 1

## 2020-07-22 MED ORDER — FLUDROCORTISONE ACETATE 0.1 MG PO TABS
0.0500 mg | ORAL_TABLET | Freq: Every day | ORAL | Status: DC
  Administered 2020-07-23 – 2020-07-24 (×2): 0.05 mg via ORAL
  Filled 2020-07-22 (×2): qty 0.5

## 2020-07-22 MED ORDER — PREDNISOLONE ACETATE 1 % OP SUSP
1.0000 [drp] | Freq: Four times a day (QID) | OPHTHALMIC | Status: DC
  Administered 2020-07-23 – 2020-07-24 (×5): 1 [drp] via OPHTHALMIC
  Filled 2020-07-22: qty 5

## 2020-07-22 MED ORDER — AMLODIPINE BESYLATE 5 MG PO TABS
2.5000 mg | ORAL_TABLET | Freq: Once | ORAL | Status: AC
  Administered 2020-07-22: 2.5 mg via ORAL
  Filled 2020-07-22: qty 1

## 2020-07-22 NOTE — BH Assessment (Signed)
Case was staffed with Rankin NP who recommended patient be observed and monitored.

## 2020-07-22 NOTE — ED Provider Notes (Signed)
[facility]-EMERGENCY DEPT Provider Note   CSN: [id number] Arrival date & time: 07/22/20  1333     History Chief Complaint  Patient presents with  . Suicidal    Patient has multiple complaints including anxiety , depresson , orthostatic hypotension    Tristan Teall Sr. is a 79 y.o. male.  HPI    Patient presents with his wife who assists with the HPI.  Patient has a history of anxiety, depression, and notably orthostatic hypotension. The patient has had innumerable evaluations for his orthostatic hypotension, has been on multiple medications, had multiple radiographic images. He continues to have symptoms, with activity, upright positioning, sudden motion. He has had worsening fixation/depression about this malady, and today, told his wife that he was tired of living secondary to this condition. No current physical pain or complaints. He has had recent change in his antidepressant, this was about 3 weeks ago. No other recent description of injury, illness.  Past Medical History:  Diagnosis Date  . Anxiety   . Depression   . GERD (gastroesophageal reflux disease)   . HLD (hyperlipidemia)   . Panic attacks   . Prostatic disorder   . Sleep apnea    uses CPAP    Patient Active Problem List   Diagnosis Date Noted  . Esophageal dysphagia 08/22/2015  . GERD (gastroesophageal reflux disease) 08/22/2015    Past Surgical History:  Procedure Laterality Date  . COLONOSCOPY W/ BIOPSIES    . INGUINAL HERNIA REPAIR Bilateral 1998   1965, left inguinal  . TRANSURETHRAL RESECTION OF PROSTATE  1992  . UPPER GASTROINTESTINAL ENDOSCOPY         Family History  Problem Relation Age of Onset  . Heart disease Mother   . Lung disease Mother   . Kidney cancer Brother        1/2 brother  . Colon cancer Neg Hx     Social History   Tobacco Use  . Smoking status: Never Smoker  . Smokeless tobacco: Never Used  Substance Use Topics  . Alcohol use: No     Alcohol/week: 0.0 standard drinks  . Drug use: No    Home Medications Prior to Admission medications   Medication Sig Start Date End Date Taking? Authorizing Provider  aspirin 81 MG tablet Take 81 mg by mouth daily.    [provider]  busPIRone (BUSPAR) 10 MG tablet Take 10 mg by mouth 2 (two) times daily.    [provider]  Cholecalciferol (VITAMIN D-3) 1000 UNITS CAPS Take 1 capsule by mouth daily.    [provider]  escitalopram (LEXAPRO) 20 MG tablet Take 20 mg by mouth daily.    [provider]  pantoprazole (PROTONIX) 40 MG tablet TAKE 1 TABLET BY MOUTH ONCE DAILY BEFORE BREAKFAST 07/06/19   Iva Boop, MD  rosuvastatin (CRESTOR) 10 MG tablet Take 10 mg by mouth daily.    [provider]    Allergies    Patient has no known allergies.  Review of Systems   Review of Systems  Constitutional:       Per HPI, otherwise negative  HENT:       Per HPI, otherwise negative  Respiratory:       Per HPI, otherwise negative  Cardiovascular:       Per HPI, otherwise negative  Gastrointestinal: Negative for vomiting.  Endocrine:       Negative aside from HPI  Genitourinary:       Neg aside from  HPI   Musculoskeletal:       Per HPI, otherwise negative  Skin: Negative.   Neurological: Positive for light-headedness. Negative for syncope.  Psychiatric/Behavioral: Positive for dysphoric mood and suicidal ideas.    Physical Exam Updated Vital Signs BP (!) 66/49 (BP Location: Left Arm)   Temp 98.7 F (37.1 C)   Resp 18   Ht 5\' 7"  (1.702 m)   Wt 71.2 kg   SpO2 93%   BMI 24.59 kg/m   Physical Exam Vitals and nursing note reviewed.  Constitutional:      General: He is not in acute distress.    Appearance: He is well-developed.  HENT:     Head: Normocephalic and atraumatic.  Eyes:     Conjunctiva/sclera: Conjunctivae normal.  Cardiovascular:     Rate and Rhythm: Normal rate and regular rhythm.  Pulmonary:     Effort:  Pulmonary effort is normal. No respiratory distress.     Breath sounds: No stridor.  Abdominal:     General: There is no distension.  Skin:    General: Skin is warm and dry.  Neurological:     Mental Status: He is alert and oriented to person, place, and time.     Comments: Poor hearing otherwise unremarkable  Psychiatric:        Behavior: Behavior is slowed and withdrawn.     ED Results / Procedures / Treatments   Labs (all labs ordered are listed, but only abnormal results are displayed) Labs Reviewed  COMPREHENSIVE METABOLIC PANEL  ETHANOL  CBC WITH DIFFERENTIAL/PLATELET  URINALYSIS, ROUTINE W REFLEX MICROSCOPIC  TSH  T4, FREE    EKG None  Radiology No results found.  Procedures Procedures (including critical care time)  Medications Ordered in ED Medications  sodium chloride 0.9 % bolus 1,000 mL (1,000 mLs Intravenous New Bag/Given 07/22/20 1600)    ED Course  I have reviewed the triage vital signs and the nursing notes.  Pertinent labs & imaging results that were available during my care of the patient were reviewed by me and considered in my medical decision making (see chart for details).  Adult male with chronic medical issues including orthostatic hypotension, as well as anxiety/depression now presents with new suicidal ideation. Some suspicion for the patient's suicidal ideation being secondary to his worsening medical condition, but given his risk factors including anxiety, depression, the patient was medically cleared for behavioral health evaluation peer Notably, clearance includes acknowledgment of the patient's baseline conditions/orthostatic hypotension.  No early evidence for concurrent new physiologic phenomena.  Final Clinical Impression(s) / ED Diagnoses Final diagnoses:  Suicidal ideation     09/21/20, MD 07/22/20 1655

## 2020-07-22 NOTE — ED Provider Notes (Addendum)
Clinical Course as of Jul 22 2238  Mon Jul 22, 2020  2023 Notified about pt's high blood pressure. Pt has history of orthostatic hypotension and had low bp initially.   Will give 2.5 mg norvasc   [JK]  2238 Wife called.  Pt needs to have his eye drops ordered.  Recent cataract surgery.  Pt has been on moxifloxacin and prednisolone eye drops.   [JK]    Clinical Course User Index [JK] Linwood Dibbles, MD      Linwood Dibbles, MD 07/22/20 8337    Linwood Dibbles, MD 07/22/20 2239

## 2020-07-22 NOTE — BH Assessment (Signed)
Assessment Note  Tristan Plemmons Sr. is an 79 y.o. male that presents this date voluntary reporting passive S/I. Patient denies any plan or intent. Patient states he "just doesn't want to go on anymore" after reporting that he has been sleeping only 2 to 3 hours a night for the past month. Patient cannot identify any specific stressors stating that his depression and anxiety has worsened in the last month with symptoms to include: feeling hopeless and suffering from excessive fatigue. Patient also reports ongoing AVH over the last month stating he "hears things in the wall" and "gets a flashlight to go look for things he thinks are in the wall." Patient denies any prior attempts or gestures at self harm. Patient denies any previous inpatient admissions associated with mental health. Patient denies any current SA issues. Wife who is at bedside provides collateral stating that patient's anxiety and depression has worsened over the last three weeks for "really no reason." Patient does report a medication change by his PCP three weeks ago stating that provider discontinued his Paxil and started him on another medication to assist with symptom management. See MAR for reported changes. Patient states he was diagnosed with depression and anxiety over 5 years ago and his PCP has been managing medications since then.   Per notes on arrival [facility] MD writes: Patient presents with his wife who assists with the HPI.  Patient has a history of anxiety, depression, and notably orthostatic hypotension. The patient has had innumerable evaluations for his orthostatic hypotension, has been on multiple medications, had multiple radiographic images. He continues to have symptoms, with activity, upright positioning, sudden motion. He has had worsening fixation/depression about this malady, and today, told his wife that he was tired of living secondary to this condition. No current physical pain or complaints. He has had recent change  in his antidepressant, this was about 3 weeks ago. No other recent description of injury, illness.  Patient is observed to be circumstantial and difficult to redirect at times. Patient is oriented x4 and appears to be organized with memory intact. Patient is noted to be hearing impaired as wife assist with communicating. Patient does not appear to be responding to internal stimuli. Case was staffed with Rankin NP who recommended patient be observed and monitored.        Diagnosis: MDD recurrent with psychotic features, severe, GAD  Past Medical History:  Past Medical History:  Diagnosis Date  . Anxiety   . Depression   . GERD (gastroesophageal reflux disease)   . HLD (hyperlipidemia)   . Panic attacks   . Prostatic disorder   . Sleep apnea    uses CPAP    Past Surgical History:  Procedure Laterality Date  . COLONOSCOPY W/ BIOPSIES    . INGUINAL HERNIA REPAIR Bilateral 1998   1965, left inguinal  . TRANSURETHRAL RESECTION OF PROSTATE  1992  . UPPER GASTROINTESTINAL ENDOSCOPY      Family History:  Family History  Problem Relation Age of Onset  . Heart disease Mother   . Lung disease Mother   . Kidney cancer Brother        1/2 brother  . Colon cancer Neg Hx     Social History:  reports that he has never smoked. He has never used smokeless tobacco. He reports that he does not drink alcohol and does not use drugs.  Additional Social History:  Alcohol / Drug Use Pain Medications: See MAR Prescriptions: See MAR Over the Counter: See MAR History  of alcohol / drug use?: No history of alcohol / drug abuse  CIWA: CIWA-Ar BP: (!) 66/49 COWS:    Allergies: No Known Allergies  Home Medications: (Not in a hospital admission)   OB/GYN Status:  No LMP for male patient.  General Assessment Data Location of Assessment: [facility] ED TTS Assessment: In system Is this a Tele or Face-to-Face Assessment?: Face-to-Face Is this an Initial Assessment or a Re-assessment for this  encounter?: Initial Assessment Patient Accompanied by:: N/A Language Other than English: No Living Arrangements: Other (Comment) (With wife) What gender do you identify as?: Male Date Telepsych consult ordered in [facility]: 07/22/20 Marital status: Married Living Arrangements: Spouse/significant other Can pt return to current living arrangement?: Yes Admission Status: Voluntary Is patient capable of signing voluntary admission?: Yes Referral Source: Self/Family/Friend Insurance type: Medicare  Medical Screening Exam [facility] Walk-in ONLY) Medical Exam completed: Yes  Crisis Care Plan Living Arrangements: Spouse/significant other Legal Guardian:  (NA) Name of Psychiatrist: None Name of Therapist: None  Education Status Is patient currently in school?: No Is the patient employed, unemployed or receiving disability?: Unemployed  Risk to self with the past 6 months Suicidal Ideation: Yes-Currently Present Has patient been a risk to self within the past 6 months prior to admission? : No Suicidal Intent: No Has patient had any suicidal intent within the past 6 months prior to admission? : No Is patient at risk for suicide?: Yes Suicidal Plan?: No Has patient had any suicidal plan within the past 6 months prior to admission? : No Access to Means: No What has been your use of drugs/alcohol within the last 12 months?: Denies Previous Attempts/Gestures: No How many times?: 0 Other Self Harm Risks:  (Declining health/mental health) Triggers for Past Attempts:  (NA) Intentional Self Injurious Behavior: None Family Suicide History: No Recent stressful life event(s): Other (Comment) (Ongoing mental health issues) Persecutory voices/beliefs?: No Depression: Yes Depression Symptoms: Feeling worthless/self pity Substance abuse history and/or treatment for substance abuse?: No Suicide prevention information given to non-admitted patients: Not applicable  Risk to Others within the past 6  months Homicidal Ideation: No Does patient have any lifetime risk of violence toward others beyond the six months prior to admission? : No Thoughts of Harm to Others: No Current Homicidal Intent: No Current Homicidal Plan: No Access to Homicidal Means: No Identified Victim: NA History of harm to others?: No Assessment of Violence: None Noted Violent Behavior Description: NA Does patient have access to weapons?: No Criminal Charges Pending?: No Does patient have a court date: No Is patient on probation?: No  Psychosis Hallucinations: Auditory Delusions: None noted  Mental Status Report Appearance/Hygiene: Unremarkable Eye Contact: Good Motor Activity: Freedom of movement Speech: Logical/coherent Level of Consciousness: Quiet/awake Mood: Depressed Affect: Appropriate to circumstance Anxiety Level: Minimal Thought Processes: Circumstantial Judgement: Partial Orientation: Person, Place, Time Obsessive Compulsive Thoughts/Behaviors: None  Cognitive Functioning Concentration: Decreased Memory: Recent Intact, Remote Intact Is patient IDD: No Insight: Fair Impulse Control: Fair Appetite: Good Have you had any weight changes? : No Change Sleep: Decreased Total Hours of Sleep: 2 Vegetative Symptoms: None  ADLScreening [facility] Assessment Services) Patient's cognitive ability adequate to safely complete daily activities?: Yes Patient able to express need for assistance with ADLs?: Yes Independently performs ADLs?: Yes (appropriate for developmental age)  Prior Inpatient Therapy Prior Inpatient Therapy: No  Prior Outpatient Therapy Prior Outpatient Therapy: No Does patient have an ACCT team?: No Does patient have Intensive In-House Services?  : No Does patient have [facility] services? :  No Does patient have P4CC services?: No  ADL Screening (condition at time of admission) Patient's cognitive ability adequate to safely complete daily activities?: Yes Is the patient deaf  or have difficulty hearing?: Yes Does the patient have difficulty seeing, even when wearing glasses/contacts?: No Does the patient have difficulty concentrating, remembering, or making decisions?: No Patient able to express need for assistance with ADLs?: Yes Does the patient have difficulty dressing or bathing?: No Independently performs ADLs?: Yes (appropriate for developmental age) Does the patient have difficulty walking or climbing stairs?: No Weakness of Legs: None Weakness of Arms/Hands: None  Home Assistive Devices/Equipment Home Assistive Devices/Equipment: None  Therapy Consults (therapy consults require a physician order) PT Evaluation Needed: No OT Evalulation Needed: No SLP Evaluation Needed: No Abuse/Neglect Assessment (Assessment to be complete while patient is alone) Abuse/Neglect Assessment Can Be Completed: Yes Physical Abuse: Denies Verbal Abuse: Denies Sexual Abuse: Denies Exploitation of patient/patient's resources: Denies Self-Neglect: Denies Values / Beliefs Cultural Requests During Hospitalization: None Spiritual Requests During Hospitalization: None Consults Spiritual Care Consult Needed: No Transition of Care Team Consult Needed: No Advance Directives (For Healthcare) Does Patient Have a Medical Advance Directive?: No Would patient like information on creating a medical advance directive?: No - Patient declined          Disposition: Case was staffed with Rankin NP who recommended patient be observed and monitored.        Disposition Initial Assessment Completed for this Encounter: Yes  On Site Evaluation by:   Reviewed with Physician:    Alfredia Ferguson 07/22/2020 4:42 PM

## 2020-07-23 ENCOUNTER — Emergency Department (HOSPITAL_COMMUNITY): Payer: Medicare Other

## 2020-07-23 ENCOUNTER — Encounter (HOSPITAL_COMMUNITY): Payer: Self-pay | Admitting: Registered Nurse

## 2020-07-23 DIAGNOSIS — R45851 Suicidal ideations: Secondary | ICD-10-CM

## 2020-07-23 DIAGNOSIS — F419 Anxiety disorder, unspecified: Secondary | ICD-10-CM | POA: Diagnosis present

## 2020-07-23 DIAGNOSIS — G2571 Drug induced akathisia: Secondary | ICD-10-CM | POA: Diagnosis present

## 2020-07-23 DIAGNOSIS — I951 Orthostatic hypotension: Secondary | ICD-10-CM | POA: Diagnosis not present

## 2020-07-23 LAB — RAPID URINE DRUG SCREEN, HOSP PERFORMED
Amphetamines: NOT DETECTED
Barbiturates: NOT DETECTED
Benzodiazepines: NOT DETECTED
Cocaine: NOT DETECTED
Opiates: NOT DETECTED
Tetrahydrocannabinol: NOT DETECTED

## 2020-07-23 MED ORDER — SENNOSIDES-DOCUSATE SODIUM 8.6-50 MG PO TABS
2.0000 | ORAL_TABLET | Freq: Two times a day (BID) | ORAL | Status: DC
  Administered 2020-07-23 – 2020-07-24 (×3): 2 via ORAL
  Filled 2020-07-23 (×3): qty 2

## 2020-07-23 MED ORDER — CLONAZEPAM 0.5 MG PO TABS
0.5000 mg | ORAL_TABLET | Freq: Two times a day (BID) | ORAL | Status: DC
  Administered 2020-07-23: 0.5 mg via ORAL
  Filled 2020-07-23: qty 1

## 2020-07-23 MED ORDER — CLONAZEPAM 1 MG PO TABS
1.0000 mg | ORAL_TABLET | ORAL | Status: AC
  Administered 2020-07-23: 1 mg via ORAL
  Filled 2020-07-23: qty 1

## 2020-07-23 MED ORDER — POLYETHYLENE GLYCOL 3350 17 G PO PACK
17.0000 g | PACK | Freq: Every day | ORAL | Status: DC
  Administered 2020-07-23 – 2020-07-24 (×2): 17 g via ORAL
  Filled 2020-07-23 (×4): qty 1

## 2020-07-23 NOTE — Consult Note (Signed)
Telepsych Consultation   Reason for Consult:  Worsening anxiety and suicidal thoughts Referring Physician: Gerhard Munch, MD  Location of Patient: [facility] ED Location of Provider: Other: Florinda Marker  Patient Identification: Tristan Sherman Sr. MRN:  [id number] Principal Diagnosis: Akathisia Diagnosis:  Principal Problem:   Akathisia Active Problems:   Anxiety   Suicidal ideations   Total Time spent with patient: 45 minutes  Subjective:   Tristan Grow Sr. is a 79 y.o. male patient admitted to [facility] ED after present with complaints of passive suicidal ideation, and worsening anxiety, restlessness .  HPI:  Tristan Zane Sr., 79 y.o., male patient seen via tele psych by this provider, Dr. Lucianne Muss; and chart reviewed on 07/23/20.  On evaluation Tristan Brunton Sr. reports he has been panicky, shaky,, anxious, and abdominal pain."  Patient states that he has been having suicidal thoughts with no intent or plan because of the anxious feeling and no medication has help.  During evaluation Tristan Sherman Sr. is alert/oriented x 4; calm/cooperative; and mood is congruent with affect.  He does not appear to be responding to internal/external stimuli or delusional thoughts; but states that he feels like he is burning on he inside of his stomach and may be possessed.  Patient denies homicidal ideation, psychosis, and paranoia.  Patient gave permission to speak to his wife.  Patient wife states that patient has no outpatient psychiatric services and was being medicated by his PCP; started on Lexapro 20 mg that worked for 5-6 year until started having so anxiety and depression; Wellbutrin was added but made anxiety worse.  The Wellbutrin and Lexapro discontinue and patient was started on Paxil around March 28, 2020 and was discontinued some time in September when Trintellix was started.  All of the mediations that patient has started had made the restlessness, anxiety, pacing, unable to sit still worse.  Patient has lost several  pounds and not sleeping and because he can't get rid of the anxiety instead constantly getting worse he has become suicidal.        Patient answered question appropriately.     Past Psychiatric History: Anxiety, Depression  Risk to Self: Suicidal Ideation: Yes-Currently Present Suicidal Intent: No Is patient at risk for suicide?: Yes Suicidal Plan?: No Access to Means: No What has been your use of drugs/alcohol within the last 12 months?: Denies How many times?: 0 Other Self Harm Risks:  (Declining health/mental health) Triggers for Past Attempts:  (NA) Intentional Self Injurious Behavior: None Risk to Others: Homicidal Ideation: No Thoughts of Harm to Others: No Current Homicidal Intent: No Current Homicidal Plan: No Access to Homicidal Means: No Identified Victim: NA History of harm to others?: No Assessment of Violence: None Noted Violent Behavior Description: NA Does patient have access to weapons?: No Criminal Charges Pending?: No Does patient have a court date: No Prior Inpatient Therapy: Prior Inpatient Therapy: No Prior Outpatient Therapy: Prior Outpatient Therapy: No Does patient have an ACCT team?: No Does patient have Intensive In-House Services?  : No Does patient have [facility] services? : No Does patient have P4CC services?: No  Past Medical History:  Past Medical History:  Diagnosis Date  . Anxiety   . Depression   . GERD (gastroesophageal reflux disease)   . HLD (hyperlipidemia)   . Panic attacks   . Prostatic disorder   . Sleep apnea    uses CPAP    Past Surgical History:  Procedure Laterality Date  . COLONOSCOPY W/ BIOPSIES    . INGUINAL  HERNIA REPAIR Bilateral 1998   1965, left inguinal  . TRANSURETHRAL RESECTION OF PROSTATE  1992  . UPPER GASTROINTESTINAL ENDOSCOPY     Family History:  Family History  Problem Relation Age of Onset  . Heart disease Mother   . Lung disease Mother   . Kidney cancer Brother        1/2 brother  . Colon  cancer Neg Hx    Family Psychiatric  History: Denies Social History:  Social History   Substance and Sexual Activity  Alcohol Use No  . Alcohol/week: 0.0 standard drinks     Social History   Substance and Sexual Activity  Drug Use No    Social History   Socioeconomic History  . Marital status: Married    Spouse name: Not on file  . Number of children: 4  . Years of education: Not on file  . Highest education level: Not on file  Occupational History  . Occupation: retired  Tobacco Use  . Smoking status: Never Smoker  . Smokeless tobacco: Never Used  Substance and Sexual Activity  . Alcohol use: No    Alcohol/week: 0.0 standard drinks  . Drug use: No  . Sexual activity: Not on file  Other Topics Concern  . Not on file  Social History Narrative   Married, retired   3 sons and 1 daughter   Social Determinants of Corporate investment banker Strain:   . Difficulty of Paying Living Expenses: Not on file  Food Insecurity:   . Worried About Programme researcher, broadcasting/film/video in the Last Year: Not on file  . Ran Out of Food in the Last Year: Not on file  Transportation Needs:   . Lack of Transportation (Medical): Not on file  . Lack of Transportation (Non-Medical): Not on file  Physical Activity:   . Days of Exercise per Week: Not on file  . Minutes of Exercise per Session: Not on file  Stress:   . Feeling of Stress : Not on file  Social Connections:   . Frequency of Communication with Friends and Family: Not on file  . Frequency of Social Gatherings with Friends and Family: Not on file  . Attends Religious Services: Not on file  . Active Member of Clubs or Organizations: Not on file  . Attends Banker Meetings: Not on file  . Marital Status: Not on file   Additional Social History:    Allergies:  No Known Allergies  Labs:  Results for orders placed or performed during the hospital encounter of 07/22/20 (from the past 48 hour(s))  Comprehensive metabolic panel      Status: None   Collection Time: 07/22/20  3:11 PM  Result Value Ref Range   Sodium 140 135 - 145 mmol/L   Potassium 3.5 3.5 - 5.1 mmol/L   Chloride 102 98 - 111 mmol/L   CO2 27 22 - 32 mmol/L   Glucose, Bld 91 70 - 99 mg/dL    Comment: Glucose reference range applies only to samples taken after fasting for at least 8 hours.   BUN 17 8 - 23 mg/dL   Creatinine, Ser 1.61 0.61 - 1.24 mg/dL   Calcium 8.9 8.9 - 09.6 mg/dL   Total Protein 7.0 6.5 - 8.1 g/dL   Albumin 4.0 3.5 - 5.0 g/dL   AST 20 15 - 41 U/L   ALT 15 0 - 44 U/L   Alkaline Phosphatase 78 38 - 126 U/L   Total Bilirubin 0.9  0.3 - 1.2 mg/dL   GFR calc non Af Amer >60 >60 mL/min   GFR calc Af Amer >60 >60 mL/min   Anion gap 11 5 - 15    Comment: Performed at [facility], [address] [place], [place] [address]  Ethanol     Status: None   Collection Time: 07/22/20  3:11 PM  Result Value Ref Range   Alcohol, Ethyl (B) <10 <10 mg/dL    Comment: (NOTE) Lowest detectable limit for serum alcohol is 10 mg/dL.  For medical purposes only. Performed at [facility], [address], [place], [place] [address]   CBC with Differential     Status: None   Collection Time: 07/22/20  3:11 PM  Result Value Ref Range   WBC 6.2 4.0 - 10.5 K/uL   RBC 4.59 4.22 - 5.81 MIL/uL   Hemoglobin 13.8 13.0 - 17.0 g/dL   HCT 09.8 39 - 52 %   MCV 91.5 80.0 - 100.0 fL   MCH 30.1 26.0 - 34.0 pg   MCHC 32.9 30.0 - 36.0 g/dL   RDW 11.9 14.7 - 82.9 %   Platelets 236 150 - 400 K/uL   nRBC 0.0 0.0 - 0.2 %   Neutrophils Relative % 68 %   Neutro Abs 4.3 1.7 - 7.7 K/uL   Lymphocytes Relative 21 %   Lymphs Abs 1.3 0.7 - 4.0 K/uL   Monocytes Relative 9 %   Monocytes Absolute 0.6 0 - 1 K/uL   Eosinophils Relative 1 %   Eosinophils Absolute 0.0 0 - 0 K/uL   Basophils Relative 0 %   Basophils Absolute 0.0 0 - 0 K/uL   Immature Granulocytes 1 %   Abs Immature Granulocytes 0.03 0.00 - 0.07 K/uL    Comment:  Performed at [facility], [address], [place], [place] [address]  TSH     Status: None   Collection Time: 07/22/20  3:11 PM  Result Value Ref Range   TSH 0.510 0.350 - 4.500 uIU/mL    Comment: Performed by a 3rd Generation assay with a functional sensitivity of <=0.01 uIU/mL. Performed at [facility], [address] [place], [place] [address]   T4, free     Status: Abnormal   Collection Time: 07/22/20  3:11 PM  Result Value Ref Range   Free T4 1.13 (H) 0.61 - 1.12 ng/dL    Comment: (NOTE) Biotin ingestion may interfere with free T4 tests. If the results are inconsistent with the TSH level, previous test results, or the clinical presentation, then consider biotin interference. If needed, order repeat testing after stopping biotin. Performed at [facility], [address], [place], [place] [address]   Respiratory Panel by RT PCR (Flu A&B, Covid) - Nasopharyngeal Swab     Status: None   Collection Time: 07/22/20  4:57 PM   Specimen: Nasopharyngeal Swab  Result Value Ref Range   SARS Coronavirus 2 by RT PCR NEGATIVE NEGATIVE    Comment: (NOTE) SARS-CoV-2 target nucleic acids are NOT DETECTED.  The SARS-CoV-2 RNA is generally detectable in upper respiratoy specimens during the acute phase of infection. The lowest concentration of SARS-CoV-2 viral copies this assay can detect is 131 copies/mL. A negative result does not preclude SARS-Cov-2 infection and should not be used as the sole basis for treatment or other patient management decisions. A negative result may occur with  improper specimen collection/handling, submission of specimen other than nasopharyngeal swab,  presence of viral mutation(s) within the areas targeted by this assay, and inadequate number of viral copies (<131 copies/mL). A negative result must be combined with clinical observations, patient history, and epidemiological information. The expected result is  Negative.  Fact Sheet for Patients:  https://www.moore.com/https://www.fda.gov/media/142436/download  Fact Sheet for Healthcare Providers:  https://www.young.biz/https://www.fda.gov/media/142435/download  This test is no t yet approved or cleared by the [place] FDA and  has been authorized for detection and/or diagnosis of SARS-CoV-2 by FDA under an Emergency Use Authorization (EUA). This EUA will remain  in effect (meaning this test can be used) for the duration of the COVID-19 declaration under Section 564(b)(1) of the Act, 21 U.S.C. section 360bbb-3(b)(1), unless the authorization is terminated or revoked sooner.     Influenza A by PCR NEGATIVE NEGATIVE   Influenza B by PCR NEGATIVE NEGATIVE    Comment: (NOTE) The Xpert Xpress SARS-CoV-2/FLU/RSV assay is intended as an aid in  the diagnosis of influenza from Nasopharyngeal swab specimens and  should not be used as a sole basis for treatment. Nasal washings and  aspirates are unacceptable for Xpert Xpress SARS-CoV-2/FLU/RSV  testing.  Fact Sheet for Patients: https://www.moore.com/https://www.fda.gov/media/142436/download  Fact Sheet for Healthcare Providers: https://www.young.biz/https://www.fda.gov/media/142435/download  This test is not yet approved or cleared by the [place] FDA and  has been authorized for detection and/or diagnosis of SARS-CoV-2 by  FDA under an Emergency Use Authorization (EUA). This EUA will remain  in effect (meaning this test can be used) for the duration of the  Covid-19 declaration under Section 564(b)(1) of the Act, 21  U.S.C. section 360bbb-3(b)(1), unless the authorization is  terminated or revoked. Performed at [facility], [address] [place], [place] [address]   Urinalysis, Routine w reflex microscopic Urine, Clean Catch     Status: Abnormal   Collection Time: 07/22/20  6:15 PM  Result Value Ref Range   Color, Urine YELLOW YELLOW   APPearance CLEAR CLEAR   Specific Gravity, Urine 1.012 1.005 - 1.030   pH 6.0 5.0 - 8.0   Glucose, UA  NEGATIVE NEGATIVE mg/dL   Hgb urine dipstick MODERATE (A) NEGATIVE   Bilirubin Urine NEGATIVE NEGATIVE   Ketones, ur NEGATIVE NEGATIVE mg/dL   Protein, ur 010100 (A) NEGATIVE mg/dL   Nitrite NEGATIVE NEGATIVE   Leukocytes,Ua TRACE (A) NEGATIVE   RBC / HPF 6-10 0 - 5 RBC/hpf   WBC, UA 6-10 0 - 5 WBC/hpf   Bacteria, UA NONE SEEN NONE SEEN   Squamous Epithelial / LPF 0-5 0 - 5   Mucus PRESENT     Comment: Performed at [facility], [address] [place], [place] [address]  Rapid urine drug screen (hospital performed)     Status: None   Collection Time: 07/23/20 10:31 AM  Result Value Ref Range   Opiates NONE DETECTED NONE DETECTED   Cocaine NONE DETECTED NONE DETECTED   Benzodiazepines NONE DETECTED NONE DETECTED   Amphetamines NONE DETECTED NONE DETECTED   Tetrahydrocannabinol NONE DETECTED NONE DETECTED   Barbiturates NONE DETECTED NONE DETECTED    Comment: (NOTE) DRUG SCREEN FOR MEDICAL PURPOSES ONLY.  IF CONFIRMATION IS NEEDED FOR ANY PURPOSE, NOTIFY LAB WITHIN 5 DAYS.  LOWEST DETECTABLE LIMITS FOR URINE DRUG SCREEN Drug Class                     Cutoff (ng/mL) Amphetamine and metabolites    1000 Barbiturate and metabolites    200 Benzodiazepine  200 Tricyclics and metabolites     300 Opiates and metabolites        300 Cocaine and metabolites        300 THC                            50 Performed at [facility], [address], [place], [place] [address]     Medications:  Current Facility-Administered Medications  Medication Dose Route Frequency Provider Last Rate Last Admin  . aspirin EC tablet 81 mg  81 mg Oral Daily Gerhard Munch, MD   81 mg at 07/23/20 1057  . cholecalciferol (VITAMIN D) tablet 1,000 Units  1,000 Units Oral Daily Gerhard Munch, MD   1,000 Units at 07/23/20 1054  . clonazePAM (KLONOPIN) tablet 0.5 mg  0.5 mg Oral BID Kamran Coker B, NP      . escitalopram (LEXAPRO) tablet 20 mg  20 mg  Oral Daily Gerhard Munch, MD      . fludrocortisone (FLORINEF) tablet 0.05 mg  0.05 mg Oral Daily Linwood Dibbles, MD   0.05 mg at 07/23/20 1054  . gatifloxacin (ZYMAXID) 0.5 % ophthalmic drops 1 drop  1 drop Left Eye QID Linwood Dibbles, MD   1 drop at 07/23/20 1057  . midodrine (PROAMATINE) tablet 2.5 mg  2.5 mg Oral TID PRN Linwood Dibbles, MD      . pantoprazole (PROTONIX) EC tablet 40 mg  40 mg Oral Daily Gerhard Munch, MD   40 mg at 07/23/20 1054  . polyethylene glycol (MIRALAX / GLYCOLAX) packet 17 g  17 g Oral Daily Alvira Monday, MD   17 g at 07/23/20 1248  . prednisoLONE acetate (PRED FORTE) 1 % ophthalmic suspension 1 drop  1 drop Left Eye QID Linwood Dibbles, MD   1 drop at 07/23/20 1058  . prednisoLONE acetate (PRED FORTE) 1 % ophthalmic suspension 1 drop  1 drop Right Eye Daily Linwood Dibbles, MD   1 drop at 07/23/20 1058  . rosuvastatin (CRESTOR) tablet 10 mg  10 mg Oral Daily Gerhard Munch, MD   10 mg at 07/23/20 1054  . senna-docusate (Senokot-S) tablet 2 tablet  2 tablet Oral BID Alvira Monday, MD   2 tablet at 07/23/20 1248   Current Outpatient Medications  Medication Sig Dispense Refill  . amoxicillin (AMOXIL) 500 MG capsule Take 500 mg by mouth 3 (three) times daily.    Marland Kitchen aspirin 81 MG tablet Take 81 mg by mouth daily.    . Cholecalciferol (VITAMIN D-3) 1000 UNITS CAPS Take 1 capsule by mouth daily.    . fludrocortisone (FLORINEF) 0.1 MG tablet Take 0.05 mg by mouth daily. Take 1/2 tablet (0.05 mg)    . midodrine (PROAMATINE) 2.5 MG tablet Take 2.5 mg by mouth 3 (three) times daily as needed (low blood pressure and or dizziness).     . rosuvastatin (CRESTOR) 10 MG tablet Take 5 mg by mouth as directed. Take 1/2 tablet (5 mg) 3 times a week    . vortioxetine HBr (TRINTELLIX) 10 MG TABS tablet Take 10 mg by mouth daily.      Musculoskeletal: Strength & Muscle Tone: within normal limits Gait & Station: normal Patient leans: N/A  Psychiatric Specialty Exam: Physical Exam Vitals  and nursing note reviewed. Exam conducted with a chaperone present.  HENT:     Head: Normocephalic.  Pulmonary:     Effort: Pulmonary effort is normal.  Musculoskeletal:  General: Normal range of motion.     Cervical back: Normal range of motion.  Neurological:     Mental Status: He is alert.  Psychiatric:        Mood and Affect: Mood is anxious.        Speech: Speech normal.        Behavior: Behavior normal. Behavior is cooperative.        Thought Content: Thought content is not paranoid or delusional. Thought content does not include homicidal ideation. Suicidal: Passive.        Cognition and Memory: Cognition and memory normal.        Judgment: Judgment normal.     Review of Systems  Psychiatric/Behavioral: Positive for confusion and decreased concentration. Negative for self-injury and sleep disturbance. The patient is nervous/anxious and is hyperactive.   All other systems reviewed and are negative.   Blood pressure 115/81, pulse 68, temperature 98.1 F (36.7 C), temperature source Oral, resp. rate 16, height 5\' 7"  (1.702 m), weight 71.2 kg, SpO2 97 %.Body mass index is 24.59 kg/m.  General Appearance: Casual  Eye Contact:  Good  Speech:  Clear and Coherent and Normal Rate  Volume:  Normal  Mood:  Angry  Affect:  Congruent  Thought Process:  Coherent, Goal Directed and Descriptions of Associations: Intact  Orientation:  Full (Time, Place, and Person)  Thought Content:  WDL  Suicidal Thoughts:  No; did have some passive suicidal thoughts  Homicidal Thoughts:  No  Memory:  Immediate;   Good Recent;   Good  Judgement:  Intact  Insight:  Fair and Present  Psychomotor Activity:  Psychomotor Retardation and Restlessness  Concentration:  Concentration: Fair and Attention Span: Fair  Recall:  Good  Fund of Knowledge:  Good  Language:  Good  Akathisia:  No  Handed:  Right  AIMS (if indicated):     Assets:  Communication Skills Desire for Improvement Financial  Resources/Insurance Housing Leisure Time Physical Health Social Support Transportation  ADL's:  Intact  Cognition:  WNL  Sleep:        Treatment Plan Summary: Medication management and Plan Monitor overnight.  Possible discharge if patient feeling better and medication working   Medication Management  Discontinue current psychotropic related to possible cause of akathisia.   Restart/Continue Lexapro 20 mg daily for depression and anxiety Start Klonopin 0.5 mg Bid for Akathisia Monitor patient over night to see if he is feeling better.  Patient wife states would feel more comfortable taking patient home tomorrow if there was an improvement in anxiety.     Disposition: Monitor over night; reassess tomorrow  This service was provided via telemedicine using a 2-way, interactive audio and video technology.  Names of all persons participating in this telemedicine service and their role in this encounter. Name: Role: NP  Name: Dr. Assunta Found Role: Psychiatrist  Name: Nelly Rout Sr. Role: Patient  Name: Tristan Sherman Role: Wife    Bertell Maria, NP 07/23/2020 1:24 PM

## 2020-07-23 NOTE — ED Provider Notes (Signed)
  Physical Exam  BP 115/81 (BP Location: Left Arm)   Pulse 68   Temp 98.1 F (36.7 C) (Oral)   Resp 16   Ht 5\' 7"  (1.702 m)   Wt 71.2 kg   SpO2 97%   BMI 24.59 kg/m   Physical Exam  ED Course/Procedures   Clinical Course as of Jul 24 1219  Mon Jul 22, 2020  2023 Notified about pt's high blood pressure. Pt has history of orthostatic hypotension and had low bp initially.   Will give 2.5 mg norvasc   [JK]  2238 Wife called.  Pt needs to have his eye drops ordered.  Recent cataract surgery.  Pt has been on moxifloxacin and prednisolone eye drops.   [JK]    Clinical Course User Index [JK] 2239, MD    Procedures  MDM   Please see previous notes for history, physical and care.  Briefly this is a 79 year old male who presented to the emergency department with suicidal ideation.  He has a history of dizziness and orthostatic blood change blood pressure changes.  When he initially arrived to the emergency department, his blood pressure low, but since then he has had higher blood pressures.  He had been taking fludrocortisone 0.1 mg and was transitioned 2.05 mg yesterday and given a single dose of 2.5 mg of amlodipine due to high blood pressures.  As needed midodrine was ordered but has not been administered.  Today, initially his blood pressures have been running high in the 180s.  Because he was so recently changed to .05 mg of fludrocortisone, discussed we will continue this dose.  His blood pressure spontaneously improved to 115/81, do not feel that further antihypertensives are indicated for him especially in the setting of known orthostatic lows.  He also reports constipation today.  He reports has not had a bowel movement in approximately 4 days.  He is passing flatus, denies nausea or vomiting.  Reports mild abdominal discomfort associated with the constipation.  Reports he had enemas with some relief in the past and is requesting that.  Discussed that we will start with oral  medications for constipation and reevaluate.  Have low suspicion for small bowel obstruction, diverticulitis, AAA by history and physical exam.   The plan with psychiatry is to continue to monitor him overnight and hopefully go home tomorrow.      75, MD 07/23/20 [phone]

## 2020-07-23 NOTE — BH Assessment (Signed)
BHH Assessment Progress Note  Per Shuvon Rankin, NP, this voluntary pt is to remain at [facility] overnight for further observation and stabilization.  This Clinical research associate will discontinue search for a geriatric psychiatry bed for him.  EDP Alvira Monday, MD and pt's nurse, Beth, have been notified.  Doylene Canning, [place] Behavioral Health Coordinator [phone]

## 2020-07-23 NOTE — BH Assessment (Signed)
BHH Assessment Progress Note  Per Nelly Rout, MD, this voluntary pt requires psychiatric hospitalization at this time.  The following facilities have been contacted to seek placement for this pt, with results as noted:  Beds available, information sent, decision pending: [facility]  At capacity: [facility]   This pt is voluntary.  If he is accepted to a facility, please discuss disposition with pt to be sure that he agree to the plan.  If a facility agrees to accept pt and the plan changes in any way please call the facility to inform them of the change.  Final disposition is pending as of this writing.  Doylene Canning, [place] Behavioral Health Coordinator [phone]

## 2020-07-23 NOTE — ED Notes (Signed)
Pt has been sleeping comfortably  Sitter at bedside

## 2020-07-23 NOTE — ED Notes (Signed)
Pt alert this shift. Pt cooperative. No s/s of distress. Pt HOH . Pt stated anxious.  Pt ambulatory and can do ADLs independently .

## 2020-07-24 DIAGNOSIS — I951 Orthostatic hypotension: Secondary | ICD-10-CM | POA: Diagnosis not present

## 2020-07-24 DIAGNOSIS — G2571 Drug induced akathisia: Secondary | ICD-10-CM

## 2020-07-24 MED ORDER — CLONAZEPAM 0.25 MG PO TBDP
0.2500 mg | ORAL_TABLET | Freq: Two times a day (BID) | ORAL | 0 refills | Status: DC
Start: 2020-07-24 — End: 2020-12-30

## 2020-07-24 MED ORDER — ESCITALOPRAM OXALATE 20 MG PO TABS
20.0000 mg | ORAL_TABLET | Freq: Every day | ORAL | 0 refills | Status: DC
Start: 2020-07-24 — End: 2022-05-19

## 2020-07-24 MED ORDER — CLONAZEPAM 0.125 MG PO TBDP
0.2500 mg | ORAL_TABLET | Freq: Two times a day (BID) | ORAL | Status: DC
  Administered 2020-07-24: 0.25 mg via ORAL
  Filled 2020-07-24: qty 2

## 2020-07-24 MED ORDER — CLONAZEPAM 0.25 MG PO TBDP
0.2500 mg | ORAL_TABLET | Freq: Two times a day (BID) | ORAL | 0 refills | Status: DC
Start: 2020-07-24 — End: 2020-07-24

## 2020-07-24 NOTE — Progress Notes (Signed)
Reassessment: Patient seen via telepsych. Chart reviewed. Patient seen with his wife at bedside with patient's permission. On assessment this morning, patient appears fatigued but reports decreased anxiety levels from yesterday. He does admit to feeling tired. He slept well overnight but continues to feel sleepy. He received 1 mg of Klonopin yesterday afternoon and was started on 0.5 mg Klonopin BID last night. His wife is expressing concern with his level of sedation with these medications. I am decreasing dose to 0.25 mg BID.   Tristan Sherman continues to report depressed mood this morning and states he is having difficulties remaining positive about life. He admits long history of negative thinking. He continues to strongly deny any suicidal plan or intent. He states that he wants to feel better. He attended counseling during prior episode of depression five years ago and felt it was helpful. He is requesting follow-up with outpatient psychiatry and also agreeable to starting counseling again.  Trintellix was discontinued and Lexapro restarted yesterday. Patient and his wife report history of good response to Lexapro in the past. Patient had several medication changes over the last few weeks and reports physical feelings of restlessness "inside my body" that sound like akithisia for several weeks. Klonopin was started yesterday for akithisia and anxiety. Patient is observed to be sitting calmly without any visible restlessness on assessment this morning. He does continue to report some feelings of restlessness but does state anxiety improved. He denies any SI/HI/AVH and contracts for safety. He shows no signs of responding to internal stimuli or of delusional thought content. Safety planning done with patient and his wife at bedside. Patient's wife states she is with him at all times, and patient reports feeling comfortable sharing with his wife if mood worsens or he develops suicidal thoughts. Patient also  advised he may call 911 if suicidal thoughts occur and will provide with crisis line number. The patient and his wife are agreeable to discharge with outpatient follow-up.   Per TTS assessment: Tristan Horger Sr. is an 79 y.o. male that presents this date voluntary reporting passive S/I. Patient denies any plan or intent. Patient states he "just doesn't want to go on anymore" after reporting that he has been sleeping only 2 to 3 hours a night for the past month. Patient cannot identify any specific stressors stating that his depression and anxiety has worsened in the last month with symptoms to include: feeling hopeless and suffering from excessive fatigue. Patient also reports ongoing AVH over the last month stating he "hears things in the wall" and "gets a flashlight to go look for things he thinks are in the wall." Patient denies any prior attempts or gestures at self harm. Patient denies any previous inpatient admissions associated with mental health. Patient denies any current SA issues. Wife who is at bedside provides collateral stating that patient's anxiety and depression has worsened over the last three weeks for "really no reason." Patient does report a medication change by his PCP three weeks ago stating that provider discontinued his Paxil and started him on another medication to assist with symptom management.  Disposition: Patient shows no evidence of acute risk of harm to self or others and is psych cleared for discharge, to be provided with outpatient psychiatry and counseling follow-up. ED RN and EDP updated.

## 2020-07-24 NOTE — ED Provider Notes (Signed)
Psychiatry has cleared patient.  He and his wife are in the room and both endorse he is not suicidal.  They feel comfortable with outpatient plan.  He appears stable for discharge.   Pricilla Loveless, MD 07/24/20 1143

## 2020-07-24 NOTE — BH Assessment (Signed)
B[facility] Assessment Progress Note  Per Marciano Sequin, NP, this voluntary pt does not require psychiatric hospitalization at this time.  Pt is psychiatrically cleared.  At [facility] request I spoke to pt, and with his verbal consent, his wife who was present with the pt, to discuss treatment options.  The [facility] [place] was under consideration, but may be inaccessible to them at this time.  I advised them to call the customer service number on pt's Medicare supplement card to find an in-network provider in their community.  I also offered information for the [facility] (where pt has had previous contact), as well as [facility], [facility], and the [facility] at [facility].  I noted that the last option offers a Partial Hospitalization Program that would be available if they can arrange for virtual programming in their home.  Finally, I mentioned the 24 hour crisis number available through [facility].  All of these resources have been included in pt's discharge instructions.  EDP Pricilla Loveless, MD and pt's nurse, Diane, have been notified.  Doylene Canning, MA Triage Specialist [phone]

## 2020-07-24 NOTE — Discharge Instructions (Addendum)
For your behavioral health needs, you are advised to follow up with outpatient psychiatry and therapy.  Call the customer service number on your health insurance card to find an in-network provider in your community, or call the [facility] at your earliest opportunity to schedule an appointment:       [facility]      [address]       [place], [place] [address]      [phone]  Other alternatives that provide psychiatry and therapy are listed below:       [facility]      [address]      [place], [place] [address]      [phone]      This provider also offers a Partial Hospitalization Program (PHP).  This program meets 4 hours a day, 5 days a week and includes daily visits with a psychiatry provider.  Due to Covid-19, this program is currently virtual.  If you are interested in this program, contact Donia Guiles, LCSW at [phone].       [facility]      [address]      [place], [place] [address]      [phone]       [facility]      [address]      [place], [place] [address]      [phone]  If you are in crisis and need immediate assistance, call [employer] at [phone].  This is a crisis number, staffed 24 hours a day, 7 days a week.

## 2020-07-24 NOTE — ED Notes (Signed)
Pt discharged home, picked up by his wife.. Discharged instructions read to pt who verbalized understanding. All belongings returned to pt. Denies SI/HI, is not delusional and not responding to internal stimuli. Escorted pt to the ED exit.

## 2020-08-29 ENCOUNTER — Ambulatory Visit: Payer: Medicare Other | Admitting: Neurology

## 2020-09-16 ENCOUNTER — Other Ambulatory Visit: Payer: Self-pay | Admitting: *Deleted

## 2020-09-16 ENCOUNTER — Encounter: Payer: Self-pay | Admitting: *Deleted

## 2020-09-18 ENCOUNTER — Ambulatory Visit: Payer: Medicare Other | Admitting: Diagnostic Neuroimaging

## 2020-09-18 ENCOUNTER — Telehealth: Payer: Self-pay | Admitting: Diagnostic Neuroimaging

## 2020-09-18 ENCOUNTER — Ambulatory Visit (INDEPENDENT_AMBULATORY_CARE_PROVIDER_SITE_OTHER): Payer: Medicare Other | Admitting: Diagnostic Neuroimaging

## 2020-09-18 ENCOUNTER — Encounter: Payer: Self-pay | Admitting: Diagnostic Neuroimaging

## 2020-09-18 VITALS — BP 84/53 | HR 80 | Ht 67.0 in | Wt 158.0 lb

## 2020-09-18 DIAGNOSIS — G20 Parkinson's disease: Secondary | ICD-10-CM

## 2020-09-18 DIAGNOSIS — I951 Orthostatic hypotension: Secondary | ICD-10-CM

## 2020-09-18 DIAGNOSIS — F419 Anxiety disorder, unspecified: Secondary | ICD-10-CM | POA: Diagnosis not present

## 2020-09-18 DIAGNOSIS — R1310 Dysphagia, unspecified: Secondary | ICD-10-CM | POA: Diagnosis not present

## 2020-09-18 MED ORDER — CARBIDOPA-LEVODOPA 25-100 MG PO TABS: 1.0000 | ORAL_TABLET | Freq: Three times a day (TID) | ORAL | 6 refills | Status: DC

## 2020-09-18 NOTE — Progress Notes (Signed)
[facility]  PATIENT: Tristan Dubose Sr. DOB: Sep 22, 1941  REFERRING CLINICIAN: Blima Dessert, FNP HISTORY FROM: Patient and wife REASON FOR VISIT: New consult   HISTORICAL  CHIEF COMPLAINT:  Chief Complaint  Patient presents with  . Dizziness and giddiness    rm 6 New Pt Wife- [place] "diagnosed with orthostatic hypertension, has anxiety, depression; hoarse voice"    HISTORY OF PRESENT ILLNESS:   79 year old male here for evaluation of orthostatic hypotension, anxiety, depression, dysphagia, dysarthria.  2018 patient started to have problems with orthostatic hypotension.  In 2019 he noticed some memory loss issues.  In 2021 he was having anxiety depression issues.  He has been treated with Florinef and midodrine with mild control of low blood pressure.  Anxiety and depression issues continue.  He was seen by psychiatry and admitted to rehab facility.  He was all submitted to behavioral health at one time.  He has had some tremor issues.  He has having problems with balance and gait difficulty.  He is concerned about possibility of Parkinson's or dementia.    REVIEW OF SYSTEMS: Full 14 system review of systems performed and negative with exception of: As per HPI.  ALLERGIES: Allergies  Allergen Reactions  . Bee Venom   . Other     Bee venom  . Rosuvastatin Calcium Other (See Comments)    HOME MEDICATIONS: Outpatient Medications Prior to Visit  Medication Sig Dispense Refill  . aspirin 81 MG tablet Take 81 mg by mouth daily.    . Cholecalciferol (VITAMIN D-3) 1000 UNITS CAPS Take 1 capsule by mouth daily.    . Cyanocobalamin (B-12) 1000 MCG CAPS B12  1000 mcg QD    . escitalopram (LEXAPRO) 20 MG tablet Take 1 tablet (20 mg total) by mouth daily. 30 tablet 0  . fludrocortisone (FLORINEF) 0.1 MG tablet Take 0.05 mg by mouth daily. Take 1/2 tablet (0.05 mg)    . midodrine (PROAMATINE) 2.5 MG tablet Take 2.5 mg by mouth 3 (three) times daily as needed (low  blood pressure and or dizziness).     . rosuvastatin (CRESTOR) 10 MG tablet Take 5 mg by mouth as directed. Take 1/2 tablet (5 mg) 3 times a week    . clonazePAM (KLONOPIN) 0.25 MG disintegrating tablet Take 1 tablet (0.25 mg total) by mouth 2 (two) times daily. (Patient not taking: Reported on 09/18/2020) 60 tablet 0  . LORazepam (ATIVAN) 0.5 MG tablet Up to 3 x daily as needed    . amoxicillin (AMOXIL) 500 MG capsule Take 500 mg by mouth 3 (three) times daily.     No facility-administered medications prior to visit.    PAST MEDICAL HISTORY: Past Medical History:  Diagnosis Date  . Anxiety   . Depression   . Dizziness and giddiness   . GERD (gastroesophageal reflux disease)   . HLD (hyperlipidemia)   . Orthostatic hypertension   . Panic attacks   . Prostatic disorder   . Sleep apnea    uses CPAP    PAST SURGICAL HISTORY: Past Surgical History:  Procedure Laterality Date  . CATARACT EXTRACTION, BILATERAL  2021  . COLONOSCOPY W/ BIOPSIES    . INGUINAL HERNIA REPAIR Bilateral 1998   1965, left inguinal  . TRANSURETHRAL RESECTION OF PROSTATE  1992  . UPPER GASTROINTESTINAL ENDOSCOPY      FAMILY HISTORY: Family History  Problem Relation Age of Onset  . Heart disease Mother   . Lung disease Mother   . Kidney cancer Brother  1/2 brother  . Colon cancer Neg Hx     SOCIAL HISTORY: Social History   Socioeconomic History  . Marital status: Married    Spouse name: [place]  . Number of children: 4  . Years of education: Not on file  . Highest education level: Bachelor's degree (e.g., BA, AB, BS)  Occupational History  . Occupation: retired  Tobacco Use  . Smoking status: Never Smoker  . Smokeless tobacco: Never Used  Substance and Sexual Activity  . Alcohol use: No    Alcohol/week: 0.0 standard drinks  . Drug use: No  . Sexual activity: Not on file  Other Topics Concern  . Not on file  Social History Narrative   Married, retired   3 sons and 1 daughter    Social Determinants of Corporate investment banker Strain:   . Difficulty of Paying Living Expenses: Not on file  Food Insecurity:   . Worried About Programme researcher, broadcasting/film/video in the Last Year: Not on file  . Ran Out of Food in the Last Year: Not on file  Transportation Needs:   . Lack of Transportation (Medical): Not on file  . Lack of Transportation (Non-Medical): Not on file  Physical Activity:   . Days of Exercise per Week: Not on file  . Minutes of Exercise per Session: Not on file  Stress:   . Feeling of Stress : Not on file  Social Connections:   . Frequency of Communication with Friends and Family: Not on file  . Frequency of Social Gatherings with Friends and Family: Not on file  . Attends Religious Services: Not on file  . Active Member of Clubs or Organizations: Not on file  . Attends Banker Meetings: Not on file  . Marital Status: Not on file  Intimate Partner Violence:   . Fear of Current or Ex-Partner: Not on file  . Emotionally Abused: Not on file  . Physically Abused: Not on file  . Sexually Abused: Not on file     PHYSICAL EXAM  GENERAL EXAM/CONSTITUTIONAL: Vitals:  Vitals:   09/18/20 0905 09/18/20 0911  BP: 102/67 (!) 84/53  Pulse: 82 80  Weight: 158 lb (71.7 kg)   Height: 5\' 7"  (1.702 m)    Body mass index is 24.75 kg/m. Wt Readings from Last 3 Encounters:  09/18/20 158 lb (71.7 kg)  07/22/20 157 lb (71.2 kg)  07/06/19 173 lb (78.5 kg)    Patient is in no distress; well developed, nourished and groomed; neck is supple  CARDIOVASCULAR:  Examination of carotid arteries is normal; no carotid bruits  Regular rate and rhythm, no murmurs  Examination of peripheral vascular system by observation and palpation is normal  EYES:  Ophthalmoscopic exam of optic discs and posterior segments is normal; no papilledema or hemorrhages No exam data present  MUSCULOSKELETAL:  Gait, strength, tone, movements noted in Neurologic exam  below  NEUROLOGIC: MENTAL STATUS:  No flowsheet data found.  awake, alert, oriented to person, place and time  recent and remote memory intact  normal attention and concentration  language fluent, comprehension intact, naming intact  fund of knowledge appropriate  CRANIAL NERVE:   2nd - no papilledema on fundoscopic exam  2nd, 3rd, 4th, 6th - pupils equal and reactive to light, visual fields full to confrontation, extraocular muscles intact, no nystagmus  5th - facial sensation symmetric  7th - facial strength symmetric  8th - hearing intact  9th - palate elevates symmetrically, uvula midline  11th - shoulder shrug symmetric  12th - tongue protrusion midline  MASKED FACIES  SOFT, HOARSE VOICE  MOTOR:   normal bulk  INCREASED TONE IN RUE > LUE (COGWHEELING RIGIDITY)  BRADYKINESIA IN LUE > RUE  full strength in the BUE; BLE 4  INTERMITTENT RESTING TREMOR IN BUE; MILD POSTURAL TREMOR IN BUE  SENSORY:   normal and symmetric to light touch, temperature, vibration  COORDINATION:   finger-nose-finger, fine finger movements normal  REFLEXES:   deep tendon reflexes 1+ and symmetric  GAIT/STATION:   narrow based gait; STOOPED POSTURE; DECR ARM SWING; SHORT STEPS; SLOW TO RISE; SLOW TURNING     DIAGNOSTIC DATA (LABS, IMAGING, TESTING) - I reviewed patient records, labs, notes, testing and imaging myself where available.  Lab Results  Component Value Date   WBC 6.2 07/22/2020   HGB 13.8 07/22/2020   HCT 42.0 07/22/2020   MCV 91.5 07/22/2020   PLT 236 07/22/2020      Component Value Date/Time   NA 140 07/22/2020 1511   K 3.5 07/22/2020 1511   CL 102 07/22/2020 1511   CO2 27 07/22/2020 1511   GLUCOSE 91 07/22/2020 1511   BUN 17 07/22/2020 1511   CREATININE 0.94 07/22/2020 1511   CALCIUM 8.9 07/22/2020 1511   PROT 7.0 07/22/2020 1511   ALBUMIN 4.0 07/22/2020 1511   AST 20 07/22/2020 1511   ALT 15 07/22/2020 1511   ALKPHOS 78 07/22/2020  1511   BILITOT 0.9 07/22/2020 1511   GFRNONAA >60 07/22/2020 1511   GFRAA >60 07/22/2020 1511   No results found for: CHOL, HDL, LDLCALC, LDLDIRECT, TRIG, CHOLHDL No results found for: PJAS5K No results found for: VITAMINB12 Lab Results  Component Value Date   TSH 0.510 07/22/2020       ASSESSMENT AND PLAN  79 y.o. 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  We will proceed with further work-up to rule out other secondary causes.  Will start empiric trial of carbidopa levodopa.  Will refer patient for physical, occupational and speech therapies.  Dx:  1. Parkinson's disease (HCC)   2. Orthostatic hypotension   3. Anxiety   4. Dysphagia, unspecified type      PLAN:  PARKINSONISM (likely idiopathic parkinson's disease) - check MRI brain (rule out secondary causes) - trial of carb/levo 25/100 --> half tab three times a day x 2 weeks, then 1 tab three times a day  - use cane / walker - refer to PT, OT, ST  ORTHOSTATIC HYPOTENSION - continue florinef / midodrine per PCP / cardiology  DEPRESSION / ANXIETY  - continue lexapro per PCP   Orders Placed This Encounter  Procedures  . MR BRAIN W WO CONTRAST  . Ambulatory referral to Physical Therapy  . Ambulatory referral to Occupational Therapy  . Ambulatory referral to Speech Therapy    Meds ordered this encounter  Medications  . carbidopa-levodopa (SINEMET IR) 25-100 MG tablet    Sig: Take 1 tablet by mouth 3 (three) times daily before meals.    Dispense:  90 tablet    Refill:  6   Return in about 6 months (around 03/19/2021).  I spent 60 minutes of face-to-face and non-face-to-face time with patient.  This included previsit chart review, lab review, study review, order entry, electronic health record documentation, patient  education.      Suanne Marker, MD 09/18/2020,  10:39 AM Certified in Neurology, Neurophysiology and Neuroimaging  [facility] [address], [address] [place], [place] [address] [phone]

## 2020-09-18 NOTE — Telephone Encounter (Signed)
Medicare/[employer] order sent to GI. No auth they will reach out to the patient to schedule.  

## 2020-09-18 NOTE — Patient Instructions (Signed)
PARKINSONISM (likely idiopathic parkinson's disease) - check MRI brain (rule out secondary causes) - trial of carb/levo 25/100 --> half tab three times a day x 2 weeks, then 1 tab three times a day  - use cane / walker - refer to PT, OT, ST  ORTHOSTATIC HYPOTENSION - continue florinef / midodrine per cardiology  DEPRESSION / ANXIETY  - continue lexapro

## 2020-10-02 ENCOUNTER — Telehealth: Payer: Self-pay | Admitting: Diagnostic Neuroimaging

## 2020-10-02 NOTE — Telephone Encounter (Signed)
Agree to hold and monitor. -VRP

## 2020-10-02 NOTE — Telephone Encounter (Signed)
Wife(on DPR) states after pt took whole carbidopa-levodopa (SINEMET IR) 25-100 MG tablet his vision was blurry, wife asking for a call to discuss if there is something she needs to do

## 2020-10-02 NOTE — Telephone Encounter (Signed)
Called wife and advised her of Dr [employer] message. Advised she may hold tonight's dose or go with plan we discussed earlier. She  verbalized understanding, appreciation.

## 2020-10-02 NOTE — Telephone Encounter (Addendum)
Called wife who stated husband is very sensitive to all medicines, most make him sleepy. He began Sinemet whole tab today, has taken 2 doses  and complained of blurred vision x 15 minutes after 2nd dose. She stated he also is post cataract surgery this summer, has to have more eye surgery and has some blurred vision anyway.  Due to his complaint of sleepiness she adjusted his doses to take with breakfast, after lunch and after supper. This has decreased sleepiness. I advised she can give supper dose and if he reports blurred vision again she can go back to 1/2 tabs for another week.  I advised will discuss with MD and call her to verify plan. She verbalized understanding, appreciation.

## 2020-10-10 ENCOUNTER — Telehealth: Payer: Self-pay | Admitting: Diagnostic Neuroimaging

## 2020-10-10 NOTE — Telephone Encounter (Signed)
Since he is having trouble tolerating it, he could try to cut down to 1/4 pill but if he does not tolerate that it needs to just be stopped

## 2020-10-10 NOTE — Telephone Encounter (Signed)
Called wife and LVM advising her of Dr Bonnita Hollow recommendations. Advised we have MD on call this holiday weekend.

## 2020-10-10 NOTE — Telephone Encounter (Signed)
Called wife who stated she cut back to 1/2 tab for a week per Dr Marjory Lies  then this morning began full tablet again. She stated patient was still sleepy on 1./2 tabs but stated he doesn't feel right. I advised will send to work in MD to see if he needs to taper off. She  verbalized understanding, appreciation.

## 2020-10-10 NOTE — Telephone Encounter (Signed)
Pt's wife [place] called stating she is needing to speak to the RN regarding the pt's carbidopa-levodopa (SINEMET IR) 25-100 MG tablet She states the pt will take his medication and 30 min later the pt is sleepy and needing to lay down. Pt also states that his head feels "funny". Please advise.

## 2020-10-14 ENCOUNTER — Telehealth: Payer: Self-pay | Admitting: *Deleted

## 2020-10-14 NOTE — Telephone Encounter (Signed)
MBS results reviewed by MD, report sent to be scanned into patient's EMR.

## 2020-10-14 NOTE — Telephone Encounter (Signed)
Received Mod Barium Swallow results from [facility], [place]. Placed on MD desk for review.

## 2020-10-18 ENCOUNTER — Telehealth: Payer: Self-pay | Admitting: Neurology

## 2020-10-18 NOTE — Telephone Encounter (Signed)
Patient's wife called the after-hours call center regarding his levodopa.  He is still very sleepy from the medication.  They had cut back to a quarter of a pill 3 times daily but he was still too sleepy from it.  At this juncture, I advised them to stop the medication altogether.  She reported that she gave him 1/2 pill this morning and he had been asleep since then.  She also reports that he has had a longer standing history of anxiety and depression.  He takes other medications including fludrocortisone.  It is possible that his sleepiness is the result of multiple players I explained to her.  Medication side effects, suboptimally treated depression may also be contributors.  For now, she is advised to stop the levodopa.  She is encouraged to check in with our office early next week.  They may be able to consider alternative medications at the time.  He may benefit from a sooner than scheduled appointment.  She is advised to discuss this at the time.  Currently, he is scheduled for June 2022.  She was advised that I would let Dr. Marjory Lies know that we spoke today.  She demonstrated understanding and agreement.

## 2020-10-21 MED ORDER — AMANTADINE HCL 100 MG PO CAPS: 100.0000 mg | ORAL_CAPSULE | Freq: Every day | ORAL | 6 refills | Status: DC

## 2020-10-21 NOTE — Telephone Encounter (Signed)
Agree to stop carb/levo. Start amantadine 100mg  in AM.   Meds ordered this encounter  Medications  . amantadine (SYMMETREL) 100 MG capsule    Sig: Take 1 capsule (100 mg total) by mouth daily.    Dispense:  30 capsule    Refill:  6   , MD 10/21/2020, 9:33 AM Certified in Neurology, Neurophysiology and Neuroimaging  [facility] [address], [address] [place], [place] [phone]

## 2020-10-21 NOTE — Addendum Note (Signed)
Addended by: Joycelyn Schmid R on: 10/21/2020 09:33 AM   Modules accepted: Orders

## 2020-10-21 NOTE — Telephone Encounter (Addendum)
Called wife and advised of new Rx. She verbalized understanding. She stated he's having anxiety and has history of anxiety. He is on lorazepam 3 x daily as needed. She then stated he is losing weight (~20 lbs in past year), but eating well. I advised she add in between high protein snacks,  consider protein drinks. He complains of pressure pain in back of head; I advised  To monitor, take Tylenol as needed. She stated he doesn't sleep through night, gets up to bathroom. She asked if he can take lorazepam at night. I advised he may.  Advised she have night lights for him to see well. She verbalized understanding, appreciation.

## 2020-12-26 ENCOUNTER — Telehealth: Payer: Self-pay | Admitting: Diagnostic Neuroimaging

## 2020-12-26 NOTE — Telephone Encounter (Signed)
Called [place] who stated patient recently saw PCP, sees him regularly. Patient has seen psychiatry and psychology; wife stated they just wanted to give him more meds which made him sleepier.

## 2020-12-26 NOTE — Telephone Encounter (Signed)
Called wife who stated during the daytime patient is either very anxious or asleep, but  patient gets a lot better late in day to night. She stated he gets very orthostatic, BP will not pick up on their machine. She is concerned about his medications contributing to his issues and requested a sooner follow up.  I scheduled him with NP. wife verbalized understanding, appreciation.

## 2020-12-26 NOTE — Telephone Encounter (Signed)
Pt's wife, [place] ([place] called, past 2 weeks he has been having medication related side effects. He is extremely anxious or extremely sleepy, blood pressure drops really low; do not if he is on the right medication. Late in the afternoon/evening he is more like hisself. Would like a call from the nurse to discuss getting a sooner appt.

## 2020-12-26 NOTE — Telephone Encounter (Signed)
I am happy to assess if she feels that amantidine is culprit and can help him wean medication. I will have to talk with Dr Marjory Lies about other agents we could use as he has not tolerated Sinemet. He is on several other medications from PCP and psychiatry that could be causing symptoms. Please make sure he has follow up scheduled with them as well.

## 2020-12-30 ENCOUNTER — Encounter: Payer: Self-pay | Admitting: Family Medicine

## 2020-12-30 ENCOUNTER — Ambulatory Visit (INDEPENDENT_AMBULATORY_CARE_PROVIDER_SITE_OTHER): Payer: Medicare Other | Admitting: Family Medicine

## 2020-12-30 VITALS — BP 101/62 | HR 68 | Ht 67.0 in | Wt 157.0 lb

## 2020-12-30 DIAGNOSIS — R1310 Dysphagia, unspecified: Secondary | ICD-10-CM | POA: Diagnosis not present

## 2020-12-30 DIAGNOSIS — I951 Orthostatic hypotension: Secondary | ICD-10-CM

## 2020-12-30 DIAGNOSIS — G20 Parkinson's disease: Secondary | ICD-10-CM | POA: Diagnosis not present

## 2020-12-30 DIAGNOSIS — F419 Anxiety disorder, unspecified: Secondary | ICD-10-CM

## 2020-12-30 NOTE — Progress Notes (Signed)
Chief Complaint  Patient presents with  . Follow-up    RM 1 With wife ([place]) Pt wife states he over reacts to medications, sensitive to everything, has a lot pressure in back of head     HISTORY OF PRESENT ILLNESS: 12/30/20 ALL:  Tristan Hausen Sr. is a 80 y.o. male here today for follow up for concerns of parkinsonism. He was seen by Dr Marjory Lies 12/2021for progressive tremor, orthostatic hypotension, dysphagia and dysarthria. Exam findings notable for resting and postural tremor in BUE, cogwheel rigidity, bradykinesia, and postural instability. Concerns for idiopathic PD discussed. MRI was ordered but not completed. Wife reports MRI in [place], [place] was normal 02/2020. Trial of Sinemet not tolerated due to worsening sleepiness and blurred vision. He was started on amantadine 100mg  daily 10/18/2020 and has been continued since. Wife called last week to report increased anxiety and sleepiness. BP would not register on home monitoring device. He seemed to improve later in the day.   About 3 weeks ago PCP discontinued Florinef due to worsening anxiety and continued midodrine PRN. He is taking it every everyday. It seems to help some but occassionally needs another dose, midday. He feels that he has fluctuating headaches with fluctuating BP.   ST recommended soft diet with small sips of thin liquids following barium swallow test. Wife reports that he has lost about 20 pounds over the past year. Wife reports he has gained 6 pounds since last being seen. Review of chart shows no change in weight. He is eating 2000 calories daily. He was participating in PT. Wife reports that PT signed off due to patient not progressing. Patient did not wish to continue OT or ST. He was participating in [facility] (about 2 weeks ago) but did not return due to anxiety. PCP is managing anxiety and depression. He does take escitalopram nightly but has been refusing Buspar. He takes Valium. He was seen by psychiatry but  did not return. He felt that they were adding more medications that made him sleepy.    HISTORY (copied from Dr [facility] previous note)  80 year old male here for evaluation of orthostatic hypotension, anxiety, depression, dysphagia, dysarthria.  2018 patient started to have problems with orthostatic hypotension.  In 2019 he noticed some memory loss issues.  In 2021 he was having anxiety depression issues.  He has been treated with Florinef and midodrine with mild control of low blood pressure.  Anxiety and depression issues continue.  He was seen by psychiatry and admitted to rehab facility.  He was all submitted to behavioral health at one time.  He has had some tremor issues.  He has having problems with balance and gait difficulty.  He is concerned about possibility of Parkinson's or dementia.   REVIEW OF SYSTEMS: Out of a complete 14 system review of symptoms, the patient complains only of the following symptoms, tremor, weakness, fatigue, depression, anxiety and all other reviewed systems are negative.    ALLERGIES: Allergies  Allergen Reactions  . Bee Venom   . Other     Bee venom  . Rosuvastatin Calcium Other (See Comments)     HOME MEDICATIONS: Outpatient Medications Prior to Visit  Medication Sig Dispense Refill  . amantadine (SYMMETREL) 100 MG capsule Take 1 capsule (100 mg total) by mouth daily. 30 capsule 6  . aspirin 81 MG tablet Take 81 mg by mouth daily.    . busPIRone (BUSPAR) 10 MG tablet Take 5 mg by mouth 2 (two) times daily.    2022  Cholecalciferol (VITAMIN D-3) 1000 UNITS CAPS Take 1 capsule by mouth daily.    . Cyanocobalamin (B-12) 1000 MCG CAPS B12  1000 mcg QD    . diazepam (VALIUM) 2 MG tablet diazepam 2 mg tablet  TAKE 1 TABLET BY MOUTH TWICE DAILY AS NEEDED    . escitalopram (LEXAPRO) 20 MG tablet Take 1 tablet (20 mg total) by mouth daily. 30 tablet 0  . midodrine (PROAMATINE) 2.5 MG tablet Take 2.5 mg by mouth 3 (three) times daily as needed (low  blood pressure and or dizziness).     . rosuvastatin (CRESTOR) 10 MG tablet Take 5 mg by mouth as directed. Take 1/2 tablet (5 mg) 3 times a week    . clonazePAM (KLONOPIN) 0.25 MG disintegrating tablet Take 1 tablet (0.25 mg total) by mouth 2 (two) times daily. (Patient not taking: No sig reported) 60 tablet 0  . fludrocortisone (FLORINEF) 0.1 MG tablet Take 0.05 mg by mouth daily. Take 1/2 tablet (0.05 mg) (Patient not taking: Reported on 12/26/2020)     No facility-administered medications prior to visit.     PAST MEDICAL HISTORY: Past Medical History:  Diagnosis Date  . Anxiety   . Depression   . Dizziness and giddiness   . GERD (gastroesophageal reflux disease)   . HLD (hyperlipidemia)   . Orthostatic hypertension   . Panic attacks   . Prostatic disorder   . Sleep apnea    uses CPAP     PAST SURGICAL HISTORY: Past Surgical History:  Procedure Laterality Date  . CATARACT EXTRACTION, BILATERAL  2021  . COLONOSCOPY W/ BIOPSIES    . INGUINAL HERNIA REPAIR Bilateral 1998   1965, left inguinal  . TRANSURETHRAL RESECTION OF PROSTATE  1992  . UPPER GASTROINTESTINAL ENDOSCOPY       FAMILY HISTORY: Family History  Problem Relation Age of Onset  . Heart disease Mother   . Lung disease Mother   . Kidney cancer Brother        1/2 brother  . Colon cancer Neg Hx      SOCIAL HISTORY: Social History   Socioeconomic History  . Marital status: Married    Spouse name: [place]  . Number of children: 4  . Years of education: Not on file  . Highest education level: Bachelor's degree (e.g., BA, AB, BS)  Occupational History  . Occupation: retired  Tobacco Use  . Smoking status: Never Smoker  . Smokeless tobacco: Never Used  Substance and Sexual Activity  . Alcohol use: No    Alcohol/week: 0.0 standard drinks  . Drug use: No  . Sexual activity: Not on file  Other Topics Concern  . Not on file  Social History Narrative   Married, retired   3 sons and 1 daughter    Social Determinants of Corporate investment banker Strain: Not on file  Food Insecurity: Not on file  Transportation Needs: Not on file  Physical Activity: Not on file  Stress: Not on file  Social Connections: Not on file  Intimate Partner Violence: Not on file      PHYSICAL EXAM  Vitals:   12/30/20 1049  BP: 101/62  Pulse: 68  Weight: 157 lb (71.2 kg)  Height: 5\' 7"  (1.702 m)   Body mass index is 24.59 kg/m.   Generalized: Well developed, in no acute distress, flat affect   Cardiology: normal rate and rhythm, no murmur auscultated  Respiratory: clear to auscultation bilaterally    Neurological examination  Mentation: Alert, oriented  to time, place, history taking. Follows all commands, soft hoarse speech, language fluent Cranial nerve II-XII: Pupils were equal round reactive to light. Extraocular movements were full, visual field were full on confrontational test. Facial sensation and strength were normal. Head turning and shoulder shrug  were normal and symmetric. Motor: The motor testing reveals 5 over 5 strength of all 4 extremities. Very slight resting tremor BUE, postural tremor noted with strength testing  Sensory: Sensory testing is intact to soft touch on all 4 extremities. No evidence of extinction is noted.  Coordination: Cerebellar testing reveals slow finger-nose-finger and heel-to-shin bilaterally.  Gait and station: Gait not assessed, today, patient symptomatic with orthostatic hypotension, in wheelchair.  Reflexes: Deep tendon reflexes are symmetric and normal bilaterally.     DIAGNOSTIC DATA (LABS, IMAGING, TESTING) - I reviewed patient records, labs, notes, testing and imaging myself where available.  Lab Results  Component Value Date   WBC 6.2 07/22/2020   HGB 13.8 07/22/2020   HCT 42.0 07/22/2020   MCV 91.5 07/22/2020   PLT 236 07/22/2020      Component Value Date/Time   NA 140 07/22/2020 1511   K 3.5 07/22/2020 1511   CL 102 07/22/2020  1511   CO2 27 07/22/2020 1511   GLUCOSE 91 07/22/2020 1511   BUN 17 07/22/2020 1511   CREATININE 0.94 07/22/2020 1511   CALCIUM 8.9 07/22/2020 1511   PROT 7.0 07/22/2020 1511   ALBUMIN 4.0 07/22/2020 1511   AST 20 07/22/2020 1511   ALT 15 07/22/2020 1511   ALKPHOS 78 07/22/2020 1511   BILITOT 0.9 07/22/2020 1511   GFRNONAA >60 07/22/2020 1511   GFRAA >60 07/22/2020 1511   No results found for: CHOL, HDL, LDLCALC, LDLDIRECT, TRIG, CHOLHDL No results found for: BTDV7O No results found for: VITAMINB12 Lab Results  Component Value Date   TSH 0.510 07/22/2020    No flowsheet data found.   No flowsheet data found.   ASSESSMENT AND PLAN  80 y.o. year old male  has a past medical history of Anxiety, Depression, Dizziness and giddiness, GERD (gastroesophageal reflux disease), HLD (hyperlipidemia), Orthostatic hypertension, Panic attacks, Prostatic disorder, and Sleep apnea. here with   Parkinson's disease (HCC) - Plan: Ambulatory referral to Neurology  Orthostatic hypotension  Dysphagia, unspecified type  Anxiety  Kenaz continues to note decline in physical functioning. He is unable to tolerate Sinemet (failed 1/4 tablet TID). He continues Amantadine but does not feel it is very effective. Blood pressures continue to fluctuate. He is symptomatic with orthostatics, today. He has not taken midodrine. I have discussed this with him and his wife. I recommend that he take midodrine daily and repeat second dose as needed. I am hesitant to change or add medications until blood pressure is stabilized. He reports being released from PT for failure to progress. I will request notes to review. He stopped going to OT and ST despite benefit. Anxiety continues to be significant co morbidity and barrier in his care. He was participating in [facility] but has not returned due to increased anxiety. He refuses to return to psychiatry as he does not feel they were helping. He has been followed  closely by PCP in [place]. Recent labs with PCP were unremarkable. MRI in 02/2020 was reportedly normal, per wife. His wife requests a referral to Dr Lesia Sago, movement specialist with [facility]. Referral placed. I have encouraged him to continue amantadine daily and monitor blood pressures closely. Once BP is stabilized, he would like  to consider retrial of Sinemet. I will discuss with Dr Marjory LiesPenumalli. Healthy lifestyle habits encouraged. He will follow up pending referral to [facility]. He and his wife verbalize understanding and agreement with this plan.    Orders Placed This Encounter  Procedures  . Ambulatory referral to Neurology    Referral Priority:   Routine    Referral Type:   Consultation    Referral Reason:   Specialty Services Required    Requested Specialty:   Neurology    Number of Visits Requested:   1     No orders of the defined types were placed in this encounter.     I spent 40 minutes of face-to-face and non-face-to-face time with patient.  This included previsit chart review, lab review, study review, order entry, electronic health record documentation, patient education.    Shawnie DapperAmy Dent Plantz, MSN, FNP-C 12/30/2020, 4:21 PM  [facility] [address], [address] [place], [place] [address] [phone]

## 2020-12-30 NOTE — Progress Notes (Signed)
I reviewed note and agree with plan.   Suanne Marker, MD 12/30/2020, 4:45 PM Certified in Neurology, Neurophysiology and Neuroimaging  [facility] [address], [address] [place], [place] [address] [phone]

## 2021-01-06 ENCOUNTER — Encounter: Payer: Self-pay | Admitting: Diagnostic Neuroimaging

## 2021-04-01 ENCOUNTER — Ambulatory Visit: Payer: Medicare Other | Admitting: Diagnostic Neuroimaging

## 2021-04-08 ENCOUNTER — Ambulatory Visit: Payer: Medicare Other | Admitting: Diagnostic Neuroimaging

## 2022-04-01 IMAGING — CR DG CHEST 2V
2 series · 2 of 2 positions shown · non-contrast
Comparison: None.

CLINICAL DATA: Medical clearance.  History of dysphagia

EXAM:
CHEST - 2 VIEW

[x chest ap]
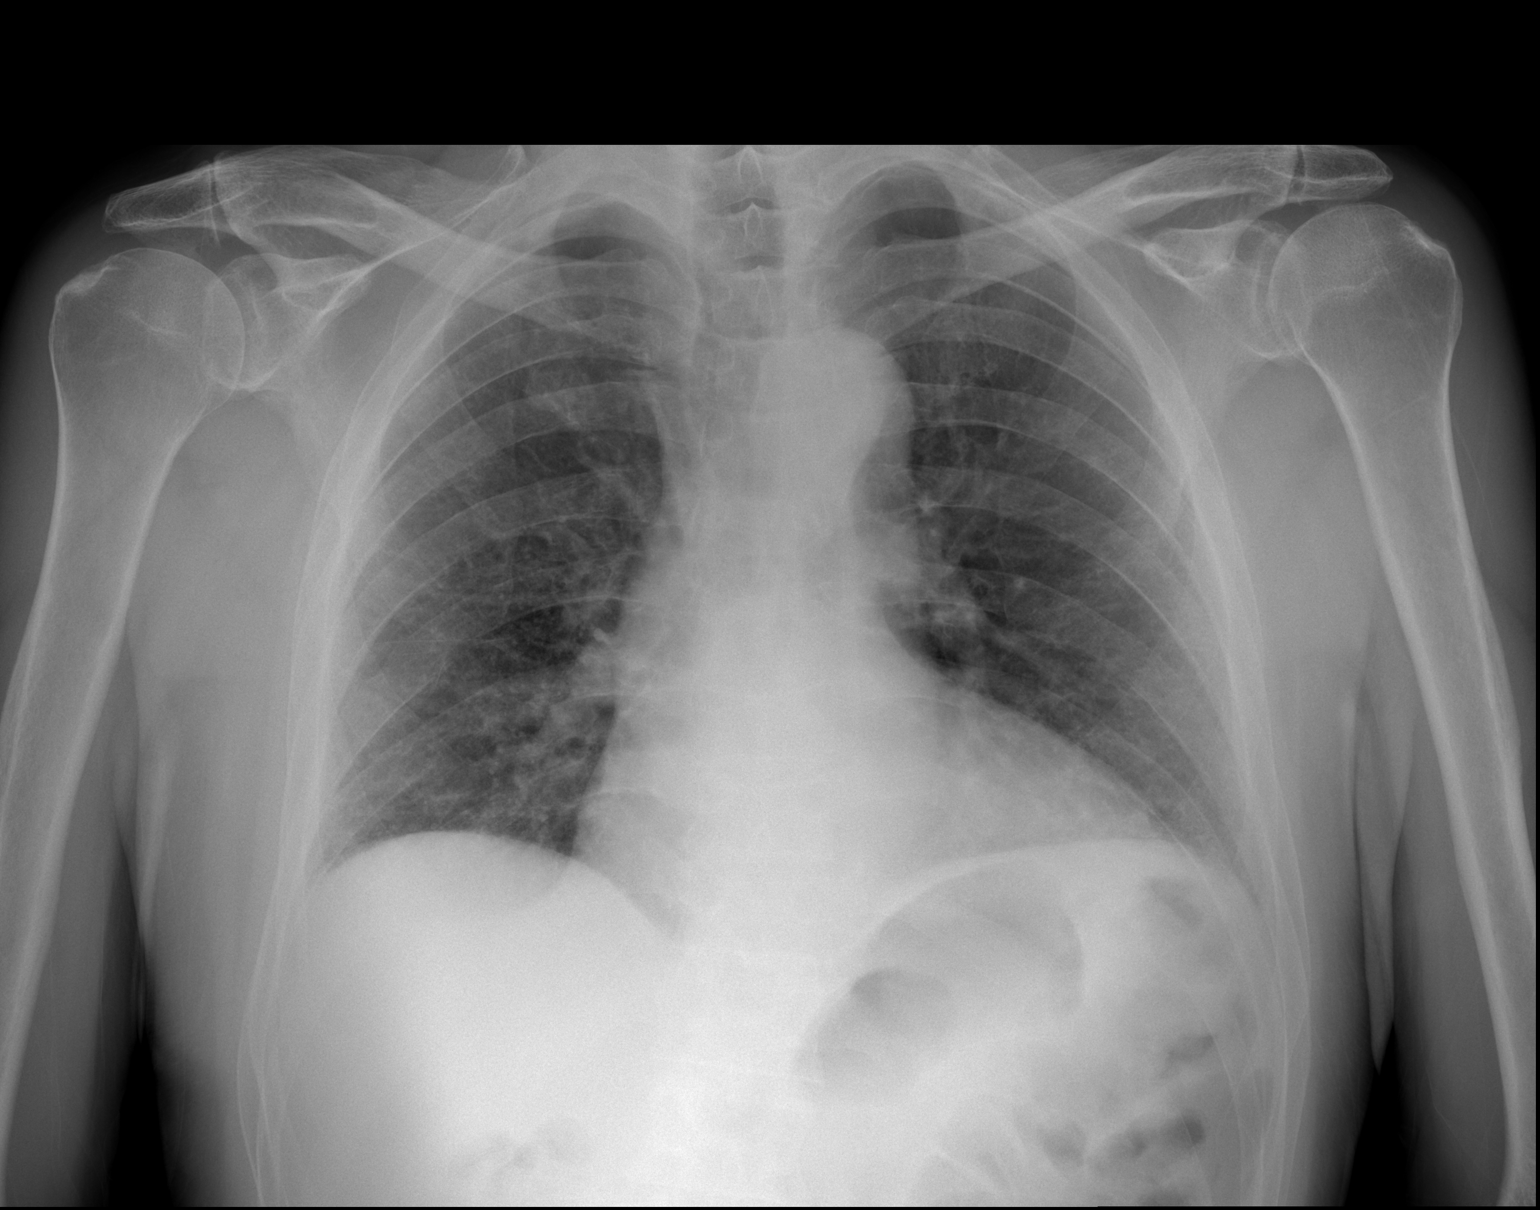

[w chest lat]
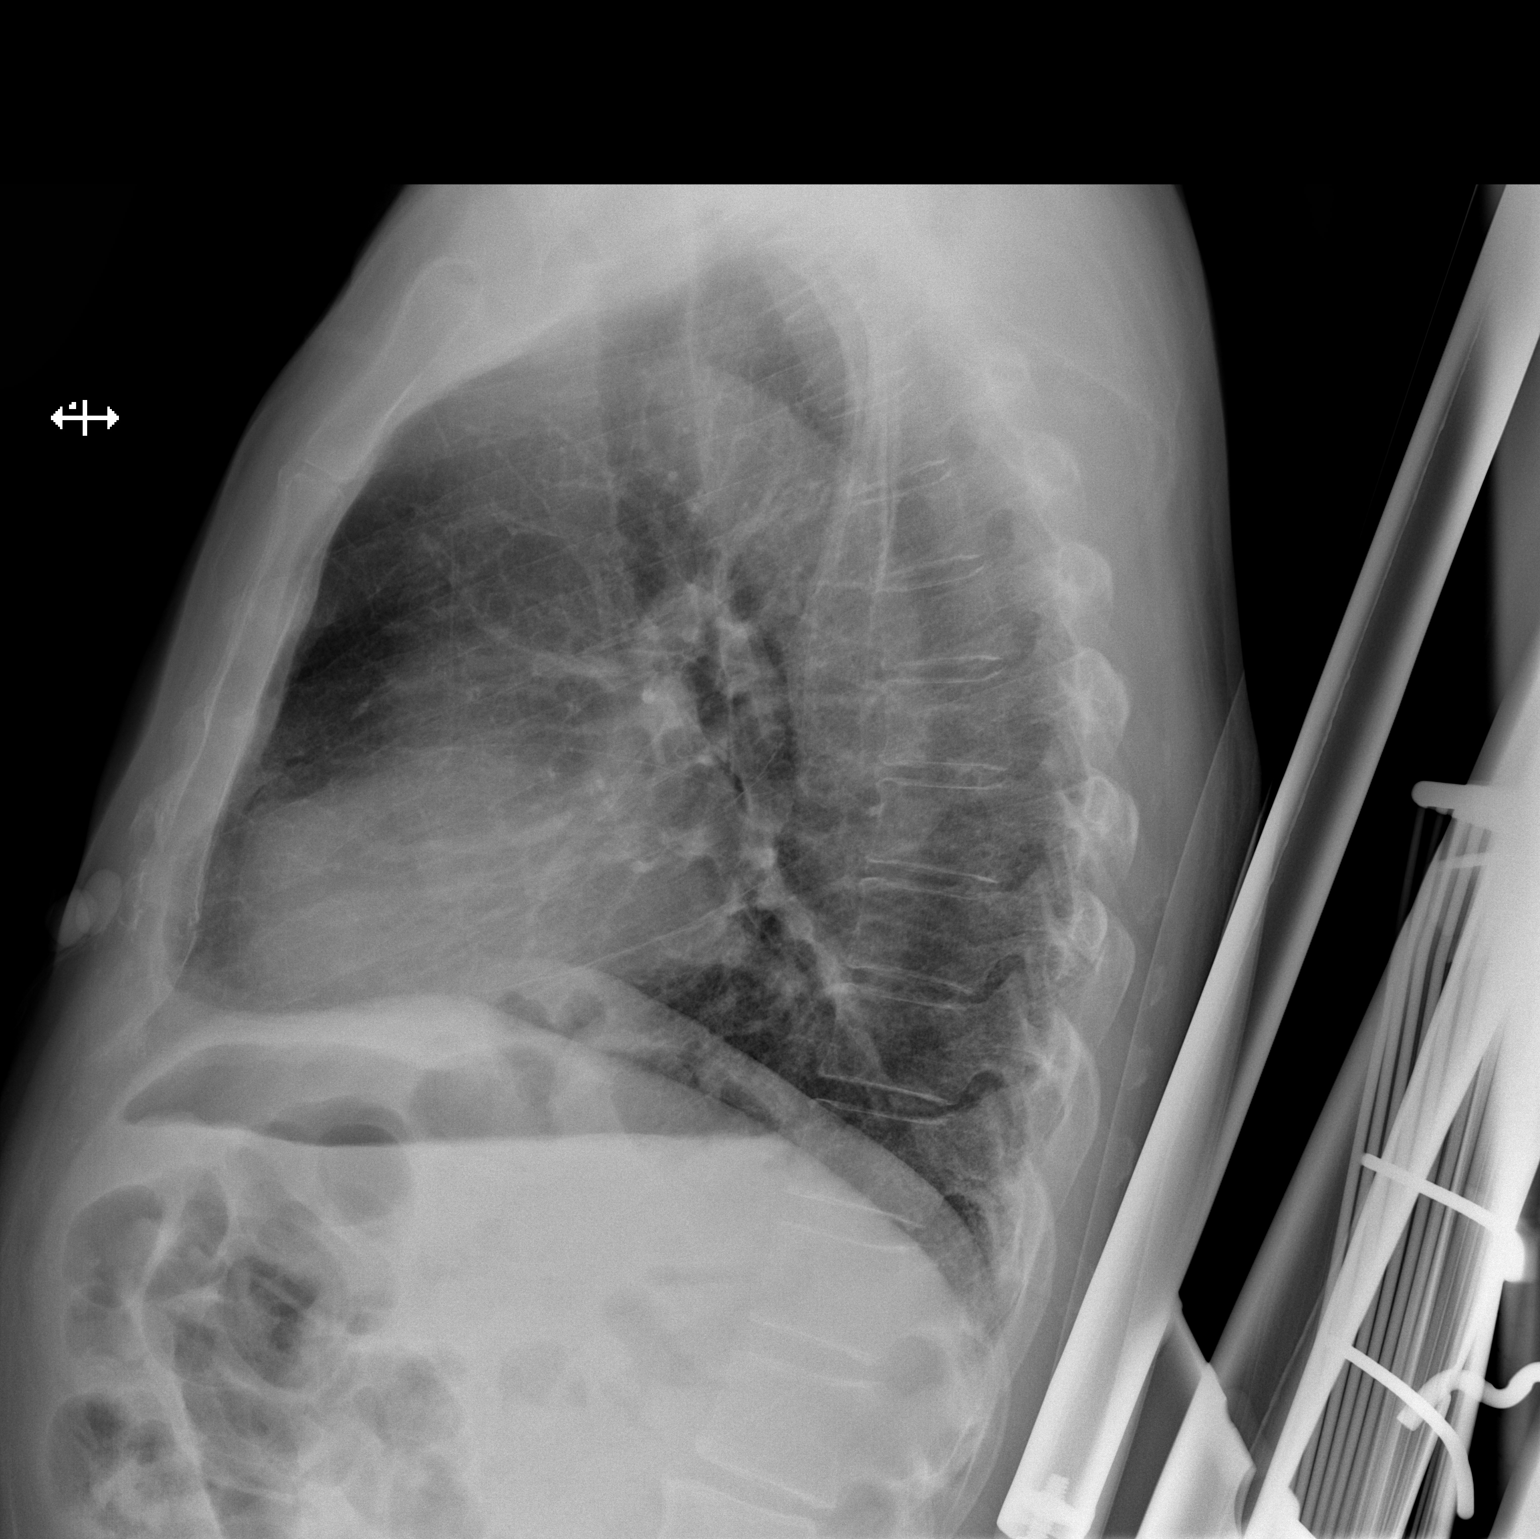

[2 of 2 positions shown; findings below may reference images not displayed]

FINDINGS: Lungs are clear. Heart is upper normal in size with pulmonary
vascularity normal. No adenopathy. There is mild degenerative change
in each shoulder.
IMPRESSION: Lungs clear.  Heart upper normal in size.  No evident adenopathy.

## 2022-05-18 ENCOUNTER — Telehealth: Payer: Self-pay | Admitting: Family Medicine

## 2022-05-18 NOTE — Telephone Encounter (Signed)
Amy, looks like wife requested referral to [facility] at last appt with you and that is how he was sent there. Are you ok for [place] to schedule appt here?

## 2022-05-18 NOTE — Telephone Encounter (Signed)
Pt's wife, Daundre Biel (on [place]) would like to speak with a nurse to discuss if he needs to schedule an appt with [facility].Prefer not to go back to [facility]. Patient sleeping a lot, having gait and posture. Pt getting physical therapy at time.

## 2022-05-19 NOTE — Telephone Encounter (Signed)
Called and spoke w/ wife. Relayed Amy/Dr. Penumalli's message. She states he has already seen geriatric psychiatrist in [place], [place]. He spent 3.5wk in hospital. Ended up going on hospicex6 months. Hospice ended up taking him off most most and only having him on effexor 75mg  po qd. He improved after this. No longer bed bound, able to ambulate.   He is currently no on meds for PD. Has no tremors. Still experiencing gait issues. He is still sleeping all day. Still having issues with anxiety/depression. Has PCP appt 06/01/22 with 06/03/22. She will discuss this with PCP.  Recommended they go back to [facility] if needing to f/u with Neurology. Could discuss referral to [facility] as another opinion if needed. She verbalized understanding and appreciation. She will call back if any further questions arise.  I updated PCP in epic and med list.

## 2024-04-18 DEATH — deceased
